# Patient Record
Sex: Female | Born: 1955 | Race: White | Hispanic: No | Marital: Married | Smoking: Never smoker
Health system: Southern US, Community
[De-identification: ages and names within clinical notes are randomized; demographics above are authoritative.]

## PROBLEM LIST (undated history)

## (undated) DIAGNOSIS — J40 Bronchitis, not specified as acute or chronic: Secondary | ICD-10-CM

## (undated) DIAGNOSIS — J45909 Unspecified asthma, uncomplicated: Secondary | ICD-10-CM

## (undated) DIAGNOSIS — Z86006 Personal history of melanoma in-situ: Secondary | ICD-10-CM

## (undated) DIAGNOSIS — M1990 Unspecified osteoarthritis, unspecified site: Secondary | ICD-10-CM

## (undated) DIAGNOSIS — J329 Chronic sinusitis, unspecified: Secondary | ICD-10-CM

## (undated) DIAGNOSIS — Z85828 Personal history of other malignant neoplasm of skin: Secondary | ICD-10-CM

## (undated) HISTORY — DX: Chronic sinusitis, unspecified: J32.9

## (undated) HISTORY — DX: Unspecified asthma, uncomplicated: J45.909

## (undated) HISTORY — DX: Unspecified osteoarthritis, unspecified site: M19.90

## (undated) HISTORY — DX: Personal history of melanoma in-situ: Z86.006

## (undated) HISTORY — DX: Personal history of other malignant neoplasm of skin: Z85.828

## (undated) HISTORY — PX: TONSILLECTOMY: SUR1361

## (undated) HISTORY — DX: Bronchitis, not specified as acute or chronic: J40

---

## 2002-10-04 HISTORY — PX: ABDOMINAL HYSTERECTOMY: SHX81

## 2004-10-30 ENCOUNTER — Inpatient Hospital Stay: Payer: Self-pay | Admitting: Rheumatology

## 2004-11-11 ENCOUNTER — Ambulatory Visit: Payer: Self-pay | Admitting: Unknown Physician Specialty

## 2004-11-27 ENCOUNTER — Ambulatory Visit: Payer: Self-pay | Admitting: Gastroenterology

## 2005-01-11 ENCOUNTER — Ambulatory Visit: Payer: Self-pay | Admitting: Gastroenterology

## 2006-09-22 ENCOUNTER — Ambulatory Visit: Payer: Self-pay | Admitting: Unknown Physician Specialty

## 2008-09-23 ENCOUNTER — Ambulatory Visit: Payer: Self-pay | Admitting: Unknown Physician Specialty

## 2009-09-24 ENCOUNTER — Ambulatory Visit: Payer: Self-pay | Admitting: Unknown Physician Specialty

## 2009-11-14 DIAGNOSIS — Z86006 Personal history of melanoma in-situ: Secondary | ICD-10-CM

## 2009-11-14 HISTORY — DX: Personal history of melanoma in-situ: Z86.006

## 2011-04-09 ENCOUNTER — Ambulatory Visit: Payer: Self-pay | Admitting: Unknown Physician Specialty

## 2012-04-14 ENCOUNTER — Ambulatory Visit: Payer: Self-pay | Admitting: Family Medicine

## 2013-04-20 ENCOUNTER — Ambulatory Visit: Payer: Self-pay | Admitting: Family Medicine

## 2013-06-27 ENCOUNTER — Ambulatory Visit: Payer: Self-pay | Admitting: Family Medicine

## 2013-09-03 ENCOUNTER — Ambulatory Visit: Payer: Self-pay | Admitting: Family Medicine

## 2013-10-29 ENCOUNTER — Encounter: Payer: Self-pay | Admitting: Pulmonary Disease

## 2013-10-29 ENCOUNTER — Ambulatory Visit (INDEPENDENT_AMBULATORY_CARE_PROVIDER_SITE_OTHER): Payer: BC Managed Care – PPO | Admitting: Pulmonary Disease

## 2013-10-29 ENCOUNTER — Encounter (INDEPENDENT_AMBULATORY_CARE_PROVIDER_SITE_OTHER): Payer: Self-pay

## 2013-10-29 VITALS — BP 118/74 | HR 58 | Temp 97.9°F | Ht 65.75 in | Wt 168.0 lb

## 2013-10-29 DIAGNOSIS — J309 Allergic rhinitis, unspecified: Secondary | ICD-10-CM | POA: Insufficient documentation

## 2013-10-29 DIAGNOSIS — J189 Pneumonia, unspecified organism: Secondary | ICD-10-CM | POA: Insufficient documentation

## 2013-10-29 DIAGNOSIS — J411 Mucopurulent chronic bronchitis: Secondary | ICD-10-CM | POA: Insufficient documentation

## 2013-10-29 NOTE — Assessment & Plan Note (Signed)
Rhinitis is contributing to some degree to her cough. She currently does not use Flonase appropriately.  Plan: -Flonase 2 puffs twice a day encouraged -Saline rinses and sprays

## 2013-10-29 NOTE — Assessment & Plan Note (Signed)
The she has no past history of lung disease which would explain why she has had such a problem with her current bronchitis this past year. However she definitely had pneumonia in September of 2014 and has really struggled since then. I am unable to see images from December. I suspect that what is going on is that she's had a series of nasty viral infection since the pneumonia and all of this has led to recurrent "cyclical" cough in that ongoing laryngeal irritation and inflammation is perpetuating the cough.  However, it does bother me that this is been such a lengthy episode and she continues to have mucus production which responds to antibiotics. It does make me consider the possibility of resistant organisms versus an atypical organism like Mycobacterium avium intracellular.  Plan: -Full pulmonary function testing to look for lung disease -CT scan of the chest to look for bronchiectasis, evidence of MAI versus interstitial lung disease -Sputum culture for bacteria, fungal, AFB -Voice rest was strongly encouraged

## 2013-10-29 NOTE — Progress Notes (Signed)
Subjective:    [name], female    DOB: 1956-05-18, 58 y.o.   MRN: [id number]  HPI  Ms. Brenda Burns is here to see me because of on going cough since December. She has had pneumonia and ongoing bronchitis and sinusitis since then.  She describes a persistent cough which continues. She initially had ccolored mucus but lately it has been clear but chunky and thick mucus. Some days are much worse than others. She notes wheezing at night sometime and cough at night.  She had a lot of coughing yesterday.  She has had sinus drainage and congestion for the last three weeks.  She has only used Flonase in terms of sinus therapy.  She only uses this on an as needed basis.    She has noted some chest pain and tightness but no shortness of breath.  This occurs in the evenings without exertion.  It occurs for only a few minutes. This does not happen with walking around.  It is not associated with eating.  No acid reflux.  She has been treated with erythromycin, Levaquin, Doxyccyline and prednisone.  Sometimes the antibiotics will help with her symptoms but then she gets worse again. Th prednisone and the levaquin seem to help the most.   She doesn't recall any sick contacts or a change in her environment.  She has not moved, no new pets, no new house.  She does not have a basement and her crawl space is dry.  She has really tried to identify any new problems.    She has had a lot of sneezing and runny nose in the last few weeks.  She has seen ENT (Dr. Virgia Land) because she had a lot of sinus congestion.    She has had a normal life without significant respiratory symtpoms aside from annual bronchitis which she said was mild.  She has had bronchitis about once a year in her adult life but always got over it really easily.  In 2014 however she has been having a lot of more trouble.   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    Past Medical History  Diagnosis Date  . Chronic sinusitis with recurrent bronchitis      Family History  Problem Relation Age of Onset  . COPD Mother   . Cancer Mother     breast  . Diabetes Paternal Grandfather      History   Social History  . Marital Status: Married    Spouse Name: N/A    Number of Children: N/A  . Years of Education: N/A   Occupational History  . Not on file.   Social History Main Topics  . Smoking status: Never Smoker   . Smokeless tobacco: Never Used  . Alcohol Use: Yes     Comment: occasional wine  . Drug Use: No  . Sexual Activity: Not on file   Other Topics Concern  . Not on file   Social History Narrative  . No narrative on file     Allergies  Allergen Reactions  . Penicillins     Hives, rash     No outpatient prescriptions prior to visit.   No facility-administered medications prior to visit.      Review of Systems  Constitutional: Negative for fever and unexpected weight change.  HENT: Positive  for congestion, postnasal drip and sinus pressure. Negative for dental problem, ear pain, nosebleeds, rhinorrhea, sneezing, sore throat and trouble swallowing.   Eyes: Negative for redness and itching.  Respiratory: Positive for cough, chest tightness, shortness of breath and wheezing.   Cardiovascular: Negative for palpitations and leg swelling.  Gastrointestinal: Negative for nausea and vomiting.  Genitourinary: Negative for dysuria.  Musculoskeletal: Negative for joint swelling.  Skin: Negative for rash.  Neurological: Negative for headaches.  Hematological: Does not bruise/bleed easily.  Psychiatric/Behavioral: Negative for dysphoric mood. The patient is not nervous/anxious.        Objective:   Physical Exam Filed Vitals:   10/29/13 1010  BP: 118/74  Pulse: 58  Temp: 97.9 F (36.6 C)  TempSrc: Oral  Height: 5' 5.75" (1.67 m)  Weight: 168 lb (76.204 kg)  SpO2: 94%   RA  Gen: well appearing, no acute distress HEENT: NCAT, PERRL, EOMi, OP clear, neck supple without masses PULM: CTA B CV: RRR, no mgr, no JVD AB: BS+, soft, nontender, no hsm Ext: warm, no edema, no clubbing, no cyanosis Derm: no rash or skin breakdown Neuro: A&Ox4, CN II-XII intact, strength 5/5 in all 4 extremities  06/27/2013 CXR> RLL pneumonia 09/2013 CXR reportedly normal, I can't see images      Assessment & Plan:   Bronchitis, mucopurulent recurrent The she has no past history of lung disease which would explain why she has had such a problem with her current bronchitis this past year. However she definitely had pneumonia in September of 2014 and has really struggled since then. I am unable to see images from December. I suspect that what is going on is that she's had a series of nasty viral infection since the pneumonia and all of this has led to recurrent "cyclical" cough in that ongoing laryngeal irritation and inflammation is perpetuating the cough.  However, it does bother me that this is been such a lengthy episode and she continues to have mucus production which responds to antibiotics. It does make me consider the possibility of resistant organisms versus an atypical organism like Mycobacterium avium intracellular.  Plan: -Full pulmonary function testing to look for lung disease -CT scan of the chest to look for bronchiectasis, evidence of MAI versus interstitial lung disease -Sputum culture for bacteria, fungal, AFB -Voice rest was strongly encouraged  CAP (community acquired pneumonia) On exam today it does not sound like she has ongoing pneumonia.  Plan: - Plan for CT chest to evaluate for interstitial disease versus bronchiectasis.   Allergic rhinitis Rhinitis is contributing to some degree to her cough. She currently does not use Flonase appropriately.  Plan: -Flonase 2 puffs twice a day encouraged -Saline rinses and sprays   Updated Medication  List Outpatient Encounter Prescriptions as of 10/29/2013  Medication Sig  . albuterol (PROVENTIL HFA;VENTOLIN HFA) 108 (90 BASE) MCG/ACT inhaler Inhale 2 puffs into the lungs every 6 (six) hours as needed for wheezing or shortness of breath.  Marland Kitchen azelastine (ASTELIN) 137 MCG/SPRAY nasal spray Place 2 sprays into both nostrils 2 (two) times daily. Use in each nostril as directed  . levofloxacin (LEVAQUIN) 500 MG tablet Take 500 mg by mouth daily.

## 2013-10-29 NOTE — Assessment & Plan Note (Signed)
On exam today it does not sound like she has ongoing pneumonia.  Plan: - Plan for CT chest to evaluate for interstitial disease versus bronchiectasis.

## 2013-10-29 NOTE — Patient Instructions (Signed)
We will order pulmonary function testing and a CT scan of your chest. For the next three days you need to stop talking, laughing, or singing and definitely [place].  Use over the counter Delsym every 12 hours.  Use hard candies and warm beverages to soothe your throat. Gradually start talking again in three days.  Use the flonase 2 puffs each nostril daily.  Use saline rinses (I like Neil Med rinses made with distilled water) twice a day.  Use chlorpheniramine-phenylephrine combination tablets for sinus congestion and cough.  We will see you back in 2 - 3 weeks or sooner if needed

## 2013-11-01 ENCOUNTER — Ambulatory Visit: Payer: Self-pay | Admitting: Pulmonary Disease

## 2013-11-01 LAB — PULMONARY FUNCTION TEST

## 2013-11-02 ENCOUNTER — Telehealth: Payer: Self-pay

## 2013-11-02 ENCOUNTER — Encounter: Payer: Self-pay | Admitting: Pulmonary Disease

## 2013-11-02 NOTE — Telephone Encounter (Signed)
LMTCB X1 

## 2013-11-02 NOTE — Telephone Encounter (Signed)
Message copied by Len Blalock on Fri Nov 02, 2013 12:43 PM ------      Message from: Simonne Maffucci B      Created: Fri Nov 02, 2013  9:09 AM       A,            Please let her know that her PFT was completely normal            Thanks      B ------

## 2013-11-05 ENCOUNTER — Encounter: Payer: Self-pay | Admitting: Pulmonary Disease

## 2013-11-05 ENCOUNTER — Telehealth: Payer: Self-pay

## 2013-11-05 NOTE — Telephone Encounter (Signed)
Message copied by Len Blalock on Mon Nov 05, 2013  9:32 AM ------      Message from: Simonne Maffucci B      Created: Mon Nov 05, 2013  9:01 AM       A,            Please let her know that her CT chest looked OK            Thanks      B ------

## 2013-11-05 NOTE — Telephone Encounter (Signed)
Pt aware of pft results.  Nothing further needed.

## 2013-11-05 NOTE — Telephone Encounter (Signed)
Pt aware of ct chest results, nothing further needed.

## 2013-11-30 ENCOUNTER — Ambulatory Visit: Payer: BC Managed Care – PPO | Admitting: Pulmonary Disease

## 2013-12-03 ENCOUNTER — Ambulatory Visit (INDEPENDENT_AMBULATORY_CARE_PROVIDER_SITE_OTHER)
Admission: RE | Admit: 2013-12-03 | Discharge: 2013-12-03 | Disposition: A | Discharge status: Home / Self Care | Payer: BC Managed Care – PPO | Source: Ambulatory Visit | Attending: Pulmonary Disease | Admitting: Pulmonary Disease

## 2013-12-03 ENCOUNTER — Ambulatory Visit (INDEPENDENT_AMBULATORY_CARE_PROVIDER_SITE_OTHER): Payer: BC Managed Care – PPO | Admitting: Pulmonary Disease

## 2013-12-03 ENCOUNTER — Encounter: Payer: Self-pay | Admitting: Pulmonary Disease

## 2013-12-03 VITALS — BP 116/64 | HR 65 | Ht 65.75 in | Wt 165.0 lb

## 2013-12-03 DIAGNOSIS — J309 Allergic rhinitis, unspecified: Secondary | ICD-10-CM

## 2013-12-03 DIAGNOSIS — R918 Other nonspecific abnormal finding of lung field: Secondary | ICD-10-CM

## 2013-12-03 DIAGNOSIS — J411 Mucopurulent chronic bronchitis: Secondary | ICD-10-CM

## 2013-12-03 DIAGNOSIS — R9389 Abnormal findings on diagnostic imaging of other specified body structures: Secondary | ICD-10-CM

## 2013-12-03 MED ORDER — HYDROCOD POLST-CHLORPHEN POLST 10-8 MG/5ML PO LQCR: 5.0000 mL | Freq: Every evening | ORAL | Status: DC | PRN

## 2013-12-03 MED ORDER — AZITHROMYCIN 250 MG PO TABS: ORAL_TABLET | ORAL | Status: DC

## 2013-12-03 NOTE — Patient Instructions (Signed)
We will call you with the results of the blood work and the chest x-ray Give [place] a sample of sputum Take the azithromycin as written Use the Advair inhaler one puff twice a day no matter how you feel Use the Tussionex at night Try really hard to suppress the cough as much as possible Use saline rinses Milta Deiters Med rinse) Use over the counter decongestants at night (phenylephrine) If no improvement in cough in 10-14 days then start taking over the counter prilosec daily We will see you back in 3 weeks or sooner if needed

## 2013-12-03 NOTE — Assessment & Plan Note (Signed)
Given the duration of her symptoms with ongoing cough I question the possibility of atypical mycobacterial disease versus pertussis. I suppose eosinophilic bronchitis could last as long as well. I think it is worthwhile to test and treat for infectious processes first and if no improvement then start empiric inhaled corticosteroids  Plan: -Repeat AFB sputum -Draw pertussis serology today -Treat with azithromycin -Flutter valve -When necessary albuterol

## 2013-12-03 NOTE — Assessment & Plan Note (Signed)
Katy had a surprising finding of an isolated right hemidiaphragm elevation today with some right lower lobe atelectasis on her chest x-ray. Her exam in that area just showed wheezing. I explained to her on the phone this evening that I am not sure if this just represents atelectasis from mucus and congestion or if she truly has hemidiaphragm weakness. This would be a new finding compared to her January and December chest imaging.  Plan: -Diaphragm fluoroscopy this week - Flutter valve

## 2013-12-03 NOTE — Assessment & Plan Note (Signed)
I continue to think that this contributes a significant amount to her cough and chest congestion.  Plan: -Add decongestant such as phenylephrine -Use saline rinses not sprays -Continue nasal steroid -Continue Zyrtec

## 2013-12-03 NOTE — Progress Notes (Signed)
Subjective:    [name], female    DOB: 1955/12/24, 58 y.o.   MRN: [id number]  Synopsis: 58 year old female who had a past history significant for annual episodes of bronchitis presented and early 2015 with severe persistent bronchitis and wheezing. Pulmonary function testing in February 2015 was completely normal as well as a CT scan of the chest. Only very small pulmonary nodules are seen in the right middle lobe. A chest x-ray in March 2015 showed an isolated elevated right hemidiaphragm which is a new finding compared to the February study.  HPI  12/03/2013 ROV > Brenda Burns feels pretty bad.  After she saw me she has had persistent cough. She tried cough suppressive therapy with Delsym, hard candies, and initially she saw some improvement. However approximately 4 days ago she started having worsening cough.  She has a rattling in her chest and lot of congestion in her chest.  It is typically brown sputum in color.  She occasionally notes blood in her mucus She has use saline sprays and Flonase. She is very anxious about this.   Past Medical History  Diagnosis Date  . Chronic sinusitis with recurrent bronchitis      Review of Systems  Constitutional: Positive for fatigue. Negative for fever.  HENT: Positive for postnasal drip, rhinorrhea and sinus pressure.   Respiratory: Positive for cough, shortness of breath and wheezing.   Cardiovascular: Negative for chest pain, palpitations and leg swelling.       Objective:   Physical Exam Filed Vitals:   12/03/13 0914  BP: 116/64  Pulse: 65  Height: 5' 5.75" (1.67 m)  Weight: 165 lb (74.844 kg)  SpO2: 96%   RA  Gen: Anxious, no acute distress HEENT: NCAT, EOMi, OP clear, PULM: Wheezing bilaterally, sounds upper airway in nature CV: RRR, no mgr, no JVD AB: BS+, soft, nontender, no hsm Ext: warm, no edema, no clubbing, no cyanosis Derm: no rash or skin breakdown  06/27/2013 CXR [facility] > RLL pneumonia 10/2013 full PFT>  ratio 77%, FEV1 2.89L (119% pred), no change with BD, TLC 5.22L (102% pred) DLCO 25.0 (113% pred) 10/2013 CT Chest> no ILD, few nonspecific pulmonary nodules no bigger than 3 mm in size March 2015 chest x-ray isolated elevated right hemidiaphragm with right lower lobe atelectasis     Assessment & Plan:   Bronchitis, mucopurulent recurrent Given the duration of her symptoms with ongoing cough I question the possibility of atypical mycobacterial disease versus pertussis. I suppose eosinophilic bronchitis could last as long as well. I think it is worthwhile to test and treat for infectious processes first and if no improvement then start empiric inhaled corticosteroids  Plan: -Repeat AFB sputum -Draw pertussis serology today -Treat with azithromycin -Flutter valve -When necessary albuterol  Abnormal chest x-ray Brenda Burns had a surprising finding of an isolated right hemidiaphragm elevation today with some right lower lobe atelectasis on her chest x-ray. Her exam in that area just showed wheezing. I explained to her on the phone this evening that I am not sure if this just represents atelectasis from mucus and congestion or if she truly has hemidiaphragm weakness. This would be a new finding compared to her January and December chest imaging.  Plan: -Diaphragm fluoroscopy this week - Flutter valve  Allergic rhinitis I continue to think that this contributes a significant amount to her cough and chest congestion.  Plan: -Add decongestant such as phenylephrine -Use saline rinses not sprays -Continue nasal steroid -Continue Zyrtec  Updated Medication List Outpatient Encounter Prescriptions as of 12/03/2013  Medication Sig  . albuterol (PROVENTIL HFA;VENTOLIN HFA) 108 (90 BASE) MCG/ACT inhaler Inhale 2 puffs into the lungs every 6 (six) hours as needed for wheezing or shortness of breath.  Marland Kitchen azelastine (ASTELIN) 137 MCG/SPRAY nasal spray Place 2 sprays into both nostrils 2 (two) times  daily. Use in each nostril as directed  . montelukast (SINGULAIR) 10 MG tablet Take 10 mg by mouth daily.  . [DISCONTINUED] levofloxacin (LEVAQUIN) 500 MG tablet Take 500 mg by mouth daily.

## 2013-12-04 ENCOUNTER — Telehealth: Payer: Self-pay

## 2013-12-04 DIAGNOSIS — R9389 Abnormal findings on diagnostic imaging of other specified body structures: Secondary | ICD-10-CM

## 2013-12-04 NOTE — Telephone Encounter (Signed)
I've called [facility] and they have no records of the cultures.  I've attempted to contact [facility] with no success.  I'll keep trying there.

## 2013-12-04 NOTE — Telephone Encounter (Signed)
Message copied by Len Blalock on Tue Dec 04, 2013  5:17 PM ------      Message from: Simonne Maffucci B      Created: Mon Dec 03, 2013  5:13 PM       A,            Can we try to track down her sputum culture results from the last visit?            Thanks,      B ------

## 2013-12-05 ENCOUNTER — Other Ambulatory Visit (INDEPENDENT_AMBULATORY_CARE_PROVIDER_SITE_OTHER): Payer: BC Managed Care – PPO

## 2013-12-05 ENCOUNTER — Other Ambulatory Visit: Payer: Self-pay

## 2013-12-05 ENCOUNTER — Other Ambulatory Visit: Payer: Self-pay | Admitting: Pulmonary Disease

## 2013-12-05 ENCOUNTER — Telehealth: Payer: Self-pay | Admitting: Pulmonary Disease

## 2013-12-05 DIAGNOSIS — J411 Mucopurulent chronic bronchitis: Secondary | ICD-10-CM

## 2013-12-05 LAB — BORDETELLA PERTUSSIS PCR
B PERTUSSIS, DNA: NOT DETECTED
B parapertussis, DNA: NOT DETECTED

## 2013-12-05 LAB — B PERTUSSIS IGG/IGM AB
B pertussis IgG Ab: 3.55 index — ABNORMAL HIGH (ref 0.00–0.94)
B pertussis IgM Ab, Quant: <1 index (ref 0.0–0.9)

## 2013-12-05 MED ORDER — FLUTTER DEVI: 1.0000 | Freq: Four times a day (QID) | Status: DC

## 2013-12-05 MED ORDER — FLUTICASONE PROPIONATE 50 MCG/ACT NA SUSP
2.0000 | Freq: Every day | NASAL | Status: DC
Start: 2013-12-05 — End: 2015-01-13

## 2013-12-05 MED ORDER — PREDNISONE 10 MG PO TABS: ORAL_TABLET | ORAL | Status: DC

## 2013-12-05 NOTE — Addendum Note (Signed)
Addended by: Len Blalock on: 12/05/2013 04:10 PM   Modules accepted: Orders

## 2013-12-05 NOTE — Telephone Encounter (Signed)
I usually give an extra three tablets in case it is needed; she should stop as directed; if the pharmacist takes issue with this then tell her to just change it to 27

## 2013-12-05 NOTE — Telephone Encounter (Signed)
Order has been placed for fluoroscopy.  Patient is aware.  Pt is coming in today to pick up flutter valve.  Nothing else needed at this time.

## 2013-12-05 NOTE — Telephone Encounter (Signed)
Clarified rx with pharmacist.  Nothing further needed.

## 2013-12-05 NOTE — Patient Instructions (Signed)
Chest fluoroscopy has been scheduled at [facility] for Tuesday, march 10 @9 :30.  Pt is to arrive at 9 @medical  mall entrance, registration is on the right.  Per the scheduler at [facility], no precautions about npo after midnight the night before.  Pt is aware of this procedure.  Nothing further needed at this time.

## 2013-12-05 NOTE — Telephone Encounter (Signed)
Called spoke with Tristen from [facility]. She wants clarification on pt prednisone. She received directions: Take 40mg  po daily for 3 days, then take 30mg  po daily for 3 days, then take 20mg  po daily for two days, then take 10mg  po daily for 2 days #30. She reports thix would equal to be #27 tablets. Does Dr. Lake Bells want pt to take as he wrote or should she be on taper for every 3 days? Please advise Dr. Lake Bells thanks

## 2013-12-05 NOTE — Telephone Encounter (Signed)
Message copied by Len Blalock on Wed Dec 05, 2013  9:30 AM ------      Message from: Simonne Maffucci B      Created: Mon Dec 03, 2013  5:15 PM       A,      Please contact her tomorrow to set up a fluoroscopy of her right hemidiaphragm> reason R hemidiaphragm elevation on CXR      Thanks      B ------

## 2013-12-11 ENCOUNTER — Telehealth: Payer: Self-pay | Admitting: Pulmonary Disease

## 2013-12-11 ENCOUNTER — Ambulatory Visit: Payer: Self-pay | Admitting: Pulmonary Disease

## 2013-12-11 NOTE — Telephone Encounter (Signed)
Order printed and refaxed to [facility]

## 2013-12-13 ENCOUNTER — Encounter: Payer: Self-pay | Admitting: Pulmonary Disease

## 2013-12-21 ENCOUNTER — Encounter: Payer: Self-pay | Admitting: Pulmonary Disease

## 2013-12-21 ENCOUNTER — Telehealth: Payer: Self-pay

## 2013-12-21 DIAGNOSIS — J411 Mucopurulent chronic bronchitis: Secondary | ICD-10-CM

## 2013-12-21 NOTE — Telephone Encounter (Signed)
Message copied by Len Blalock on Fri Dec 21, 2013  5:28 PM ------      Message from: Juanito Doom      Created: Fri Dec 21, 2013  6:31 AM       A,            Please let her know that her right diaphragm didn't move quite as much as the radiologist would normally expect, but that it was still moving which was good.              Considering her pulmonary function tests are normal I'm not too worried about this.  However, we will follow this closely with another CXR the next time I see her.  OK to order this            Thanks      B ------

## 2013-12-21 NOTE — Telephone Encounter (Signed)
Pt aware of results and recs.  CXR order to [facility] sent.  Nothing further needed.

## 2013-12-24 ENCOUNTER — Ambulatory Visit (INDEPENDENT_AMBULATORY_CARE_PROVIDER_SITE_OTHER)
Admission: RE | Admit: 2013-12-24 | Discharge: 2013-12-24 | Disposition: A | Discharge status: Home / Self Care | Payer: BC Managed Care – PPO | Source: Ambulatory Visit | Attending: Pulmonary Disease | Admitting: Pulmonary Disease

## 2013-12-24 DIAGNOSIS — J411 Mucopurulent chronic bronchitis: Secondary | ICD-10-CM

## 2013-12-25 ENCOUNTER — Telehealth: Payer: Self-pay

## 2013-12-25 NOTE — Telephone Encounter (Signed)
Message copied by Shavaughn Seidl L on Tue Dec 25, 2013  9:03 AM ------      Message from: Juanito Doom      Created: Tue Dec 25, 2013  7:07 AM       A,            Please let her know that this showed improvement in the diaphragm problem seen on the last chest X-ray.            Thanks      B ------

## 2013-12-25 NOTE — Telephone Encounter (Signed)
Relayed results and recs to pt.  Nothing further needed at this time.

## 2013-12-26 ENCOUNTER — Ambulatory Visit (INDEPENDENT_AMBULATORY_CARE_PROVIDER_SITE_OTHER): Payer: BC Managed Care – PPO | Admitting: Pulmonary Disease

## 2013-12-26 ENCOUNTER — Encounter: Payer: Self-pay | Admitting: Pulmonary Disease

## 2013-12-26 VITALS — BP 112/70 | HR 64 | Ht 65.75 in | Wt 164.0 lb

## 2013-12-26 DIAGNOSIS — R918 Other nonspecific abnormal finding of lung field: Secondary | ICD-10-CM

## 2013-12-26 DIAGNOSIS — R9389 Abnormal findings on diagnostic imaging of other specified body structures: Secondary | ICD-10-CM

## 2013-12-26 DIAGNOSIS — Z23 Encounter for immunization: Secondary | ICD-10-CM

## 2013-12-26 DIAGNOSIS — J411 Mucopurulent chronic bronchitis: Secondary | ICD-10-CM

## 2013-12-26 NOTE — Assessment & Plan Note (Signed)
She had mild hemidiaphragm weakness which appears to have resolved radiographically without further symptoms.  It is unclear what contributed to this but we will need to remember this and get a Chest Xray when she gets sick in the future with a cough or cold.

## 2013-12-26 NOTE — Assessment & Plan Note (Signed)
This has finally resolved thankfully. Given her recurrent episodes of bronchitis (though without asthma or another underlying lung disease) it is reasonable for her to have a pneumonia vaccine.  Today we talked about the importance of hand hygeine and flu shots for prevention.  Plan: -prevnar 13 vaccine

## 2013-12-26 NOTE — Patient Instructions (Signed)
When you get a cold use the following medicines: Sinus symptoms> saline sprays or rinses; phenylephrine or pseudophed; with an antihistamine like chlorpheniramine Cough> dextromethorphan  When you get a cough productive of mucus: See a doctor for antibiotics sooner rather than later  We will see you back in October 2015

## 2013-12-26 NOTE — Progress Notes (Signed)
Subjective:    [name], female    DOB: 1956-06-17, 58 y.o.   MRN: [id number]  Synopsis: 58 year old female who had a past history significant for annual episodes of bronchitis presented and early 2015 with severe persistent bronchitis and wheezing. Pulmonary function testing in February 2015 was completely normal as well as a CT scan of the chest. Only very small pulmonary nodules are seen in the right middle lobe. A chest x-ray in March 2015 showed an isolated elevated right hemidiaphragm which is a new finding compared to the February study.  HPI   12/03/2013 ROV > Shanay feels pretty bad.  After she saw me she has had persistent cough. She tried cough suppressive therapy with Delsym, hard candies, and initially she saw some improvement. However approximately 4 days ago she started having worsening cough.  She has a rattling in her chest and lot of congestion in her chest.  It is typically brown sputum in color.  She occasionally notes blood in her mucus She has use saline sprays and Flonase. She is very anxious about this.  12/26/2013 ROV > Loreley is doing great.  She has a small amount of discomfort in her left chest wall which was worse a month ago but better now.  She had soreness with a cough but that hs resolved.  The cough has resolved.  She has no shortness of breath.     Past Medical History  Diagnosis Date  . Chronic sinusitis with recurrent bronchitis      Review of Systems  Constitutional: Negative for fever and fatigue.  HENT: Negative for postnasal drip, rhinorrhea and sinus pressure.   Respiratory: Negative for cough, shortness of breath and wheezing.   Cardiovascular: Negative for chest pain, palpitations and leg swelling.       Objective:   Physical Exam  Filed Vitals:   12/26/13 1000  BP: 112/70  Pulse: 64  Height: 5' 5.75" (1.67 m)  Weight: 164 lb (74.39 kg)  SpO2: 96%   RA  Gen: no acute distress HEENT: NCAT, EOMi, OP clear, PULM:  Clear to auscultation bilaterally CV: RRR, no mgr, no JVD AB: BS+, soft, nontender, no hsm Ext: warm, no edema, no clubbing, no cyanosis Derm: no rash or skin breakdown  06/27/2013 CXR [facility] > RLL pneumonia 10/2013 full PFT> ratio 77%, FEV1 2.89L (119% pred), no change with BD, TLC 5.22L (102% pred) DLCO 25.0 (113% pred) 10/2013 CT Chest> no ILD, few nonspecific pulmonary nodules no bigger than 3 mm in size March 2015 chest x-ray isolated elevated right hemidiaphragm with right lower lobe atelectasis     Assessment & Plan:   Bronchitis, mucopurulent recurrent This has finally resolved thankfully. Given her recurrent episodes of bronchitis (though without asthma or another underlying lung disease) it is reasonable for her to have a pneumonia vaccine.  Today we talked about the importance of hand hygeine and flu shots for prevention.  Plan: -prevnar 13 vaccine  Abnormal chest x-ray She had mild hemidiaphragm weakness which appears to have resolved radiographically without further symptoms.  It is unclear what contributed to this but we will need to remember this and get a Chest Xray when she gets sick in the future with a cough or cold.    Updated Medication List Outpatient Encounter Prescriptions as of 12/26/2013  Medication Sig  . albuterol (PROVENTIL HFA;VENTOLIN HFA) 108 (90 BASE) MCG/ACT inhaler Inhale 2 puffs into the lungs every 6 (six) hours as needed for wheezing or shortness of  breath.  Marland Kitchen azelastine (ASTELIN) 137 MCG/SPRAY nasal spray Place 2 sprays into both nostrils 2 (two) times daily. Use in each nostril as directed  . fluticasone (FLONASE) 50 MCG/ACT nasal spray Place 2 sprays into both nostrils daily.  . montelukast (SINGULAIR) 10 MG tablet Take 10 mg by mouth daily.  . chlorpheniramine-HYDROcodone (TUSSIONEX PENNKINETIC ER) 10-8 MG/5ML LQCR Take 5 mLs by mouth at bedtime as needed for cough.  Marland Kitchen Respiratory Therapy Supplies (FLUTTER) DEVI 1 Device by Does not apply route 4  (four) times daily.  . [DISCONTINUED] azithromycin (ZITHROMAX) 250 MG tablet Take 500mg  on day one, then 250mg  daily for 4 days  . [DISCONTINUED] predniSONE (DELTASONE) 10 MG tablet Take 40mg  po daily for 3 days, then take 30mg  po daily for 3 days, then take 20mg  po daily for two days, then take 10mg  po daily for 2 days

## 2013-12-27 ENCOUNTER — Encounter: Payer: Self-pay | Admitting: Pulmonary Disease

## 2014-01-20 LAB — AFB CULTURE WITH SMEAR (NOT AT ARMC): ACID FAST SMEAR: NONE SEEN

## 2014-02-04 ENCOUNTER — Encounter: Payer: Self-pay | Admitting: Pulmonary Disease

## 2014-03-07 ENCOUNTER — Encounter: Payer: Self-pay | Admitting: Internal Medicine

## 2014-03-07 ENCOUNTER — Ambulatory Visit (INDEPENDENT_AMBULATORY_CARE_PROVIDER_SITE_OTHER): Payer: BC Managed Care – PPO | Admitting: Internal Medicine

## 2014-03-07 ENCOUNTER — Encounter (INDEPENDENT_AMBULATORY_CARE_PROVIDER_SITE_OTHER): Payer: Self-pay

## 2014-03-07 VITALS — BP 130/80 | HR 58 | Temp 97.5°F | Ht 65.75 in | Wt 166.8 lb

## 2014-03-07 DIAGNOSIS — J45909 Unspecified asthma, uncomplicated: Secondary | ICD-10-CM

## 2014-03-07 DIAGNOSIS — E7800 Pure hypercholesterolemia, unspecified: Secondary | ICD-10-CM

## 2014-03-07 DIAGNOSIS — Z1239 Encounter for other screening for malignant neoplasm of breast: Secondary | ICD-10-CM

## 2014-03-07 DIAGNOSIS — J411 Mucopurulent chronic bronchitis: Secondary | ICD-10-CM

## 2014-03-07 DIAGNOSIS — J309 Allergic rhinitis, unspecified: Secondary | ICD-10-CM

## 2014-03-07 DIAGNOSIS — M129 Arthropathy, unspecified: Secondary | ICD-10-CM

## 2014-03-07 DIAGNOSIS — N809 Endometriosis, unspecified: Secondary | ICD-10-CM

## 2014-03-07 DIAGNOSIS — J40 Bronchitis, not specified as acute or chronic: Secondary | ICD-10-CM

## 2014-03-07 DIAGNOSIS — M1990 Unspecified osteoarthritis, unspecified site: Secondary | ICD-10-CM

## 2014-03-07 MED ORDER — TRIAMCINOLONE ACETONIDE 0.1 % EX CREA: 1.0000 "application " | TOPICAL_CREAM | Freq: Two times a day (BID) | CUTANEOUS | Status: DC

## 2014-03-07 NOTE — Progress Notes (Signed)
Pre visit review using our clinic review tool, if applicable. No additional management support is needed unless otherwise documented below in the visit note. 

## 2014-03-10 ENCOUNTER — Encounter: Payer: Self-pay | Admitting: Internal Medicine

## 2014-03-10 DIAGNOSIS — M1990 Unspecified osteoarthritis, unspecified site: Secondary | ICD-10-CM | POA: Insufficient documentation

## 2014-03-10 DIAGNOSIS — N809 Endometriosis, unspecified: Secondary | ICD-10-CM | POA: Insufficient documentation

## 2014-03-10 NOTE — Assessment & Plan Note (Signed)
Controlled on current regimen.   

## 2014-03-10 NOTE — Assessment & Plan Note (Signed)
Hand stiffness.  Stable.  Follow.

## 2014-03-10 NOTE — Assessment & Plan Note (Signed)
S/p hysterectomy.  

## 2014-03-10 NOTE — Progress Notes (Signed)
Subjective:    [name], female    DOB: 10-18-1955, 58 y.o.   MRN: [id number]  HPI 58 year old female with past history of reoccurring bronchitis and asthma who comes in today to follow up on these issues as well as to establish care.  Former pt of Dr Gorden Harms.  Has been seeing Dr Venia Minks.  In 8/14 she was diagnosed with pneumonia.  Then reports reoccurring episodes of bronchitis.  Treated.  Seeing Dr Lake Bells.  Refer to his notes for details.  Breathing is doing well.  No cough or congestion.  Takes singulair on a regular basis.  She is doing well on her current regimen.  Rarely has to use her rescue inhaler.  Stays active.  Eating and drinking well.  Has retired.  Plans to retire soon.  Bowels stable.  No acid reflux.     Past Medical History  Diagnosis Date  . Chronic sinusitis with recurrent bronchitis   . Asthma   . Arthritis     Outpatient Encounter Prescriptions as of 03/07/2014  Medication Sig  . B Complex-C (B-COMPLEX WITH VITAMIN C) tablet Take 1 tablet by mouth daily.  . Calcium Carbonate-Vitamin D (CALTRATE 600+D PO) Take by mouth.  . cetirizine (ZYRTEC) 10 MG tablet Take 10 mg by mouth daily.  . fluticasone (FLONASE) 50 MCG/ACT nasal spray Place 2 sprays into both nostrils daily.  . montelukast (SINGULAIR) 10 MG tablet Take 10 mg by mouth daily.  . Multiple Vitamins-Minerals (CENTRUM SILVER PO) Take by mouth.  Marland Kitchen omeprazole (PRILOSEC) 20 MG capsule Take 20 mg by mouth daily.  . Red Yeast Rice Extract (RED YEAST RICE PO) Take by mouth.  . triamcinolone cream (KENALOG) 0.1 % Apply 1 application topically 2 (two) times daily.  . [DISCONTINUED] albuterol (PROVENTIL HFA;VENTOLIN HFA) 108 (90 BASE) MCG/ACT inhaler Inhale 2 puffs into the lungs every 6 (six) hours as needed for wheezing or shortness of breath.  . [DISCONTINUED] azelastine (ASTELIN) 137 MCG/SPRAY nasal spray Place 2 sprays into both nostrils 2 (two) times daily. Use in each nostril as directed  .  [DISCONTINUED] chlorpheniramine-HYDROcodone (TUSSIONEX PENNKINETIC ER) 10-8 MG/5ML LQCR Take 5 mLs by mouth at bedtime as needed for cough.  . [DISCONTINUED] Respiratory Therapy Supplies (FLUTTER) DEVI 1 Device by Does not apply route 4 (four) times daily.    Review of Systems Patient denies any headache, lightheadedness or dizziness.  No sinus or allergy symptoms currently.  No chest pain, tightness or palpitations.  No increased shortness of breath, cough or congestion.  Breathing doing well.  No nausea or vomiting.  No acid reflux.  No abdominal pain or cramping.  No bowel change, such as diarrhea, constipation, BRBPR or melana.  No urine change.   Does have some hand stiffness.  Some arthritis in her hands.       Objective:   Physical Exam Filed Vitals:   03/07/14 1025  BP: 130/80  Pulse: 58  Temp: 97.5 F (87.101 C)   58 year old female in no acute distress.   HEENT:  Nares- clear.  Oropharynx - without lesions. NECK:  Supple.  Nontender.  No audible bruit.  HEART:  Appears to be regular. LUNGS:  No crackles or wheezing audible.  Respirations even and unlabored.  RADIAL PULSE:  Equal bilaterally.  ABDOMEN:  Soft, nontender.  Bowel sounds present and normal.  No audible abdominal bruit.   EXTREMITIES:  No increased edema present.  DP pulses palpable and equal bilaterally.  Assessment & Plan:  HEALTH MAINTENANCE.  Schedule her for a physical next visit.  Schedule mammogram.  Last 7/14.  Last colonoscopy 2009.  Has never had an abnormal pap smear.    I spent 30 minutes with the patient and more than 50% of the time was spent in consultation regarding the above.

## 2014-03-10 NOTE — Assessment & Plan Note (Signed)
Has resolved now.  Doing well.

## 2014-04-22 ENCOUNTER — Encounter: Payer: Self-pay | Admitting: *Deleted

## 2014-04-22 ENCOUNTER — Ambulatory Visit: Payer: Self-pay | Admitting: Internal Medicine

## 2014-04-22 LAB — HM MAMMOGRAPHY: HM Mammogram: NEGATIVE

## 2014-04-27 LAB — URINALYSIS, COMPLETE
Bilirubin,UR: NEGATIVE
Blood: NEGATIVE
GLUCOSE, UR: NEGATIVE mg/dL (ref 0–75)
KETONE: NEGATIVE
LEUKOCYTE ESTERASE: NEGATIVE
Nitrite: NEGATIVE
Ph: 9 (ref 4.5–8.0)
RBC,UR: 2 /HPF (ref 0–5)
SPECIFIC GRAVITY: 1.018 (ref 1.003–1.030)
Squamous Epithelial: <1
WBC UR: 4 /HPF (ref 0–5)

## 2014-04-27 LAB — CBC WITH DIFFERENTIAL/PLATELET
BASOS PCT: 0.2 %
Basophil #: 0 10*3/uL (ref 0.0–0.1)
EOS ABS: 0 10*3/uL (ref 0.0–0.7)
Eosinophil %: 0.1 %
HCT: 44.3 % (ref 35.0–47.0)
HGB: 14.8 g/dL (ref 12.0–16.0)
LYMPHS PCT: 8.5 %
Lymphocyte #: 0.9 10*3/uL — ABNORMAL LOW (ref 1.0–3.6)
MCH: 29 pg (ref 26.0–34.0)
MCHC: 33.4 g/dL (ref 32.0–36.0)
MCV: 87 fL (ref 80–100)
Monocyte #: 0.5 x10 3/mm (ref 0.2–0.9)
Monocyte %: 4.8 %
Neutrophil #: 8.8 10*3/uL — ABNORMAL HIGH (ref 1.4–6.5)
Neutrophil %: 86.4 %
Platelet: 197 10*3/uL (ref 150–440)
RBC: 5.1 10*6/uL (ref 3.80–5.20)
RDW: 13.2 % (ref 11.5–14.5)
WBC: 10.2 10*3/uL (ref 3.6–11.0)

## 2014-04-27 LAB — COMPREHENSIVE METABOLIC PANEL
Albumin: 4 g/dL (ref 3.4–5.0)
Alkaline Phosphatase: 86 U/L
Anion Gap: 9 (ref 7–16)
BUN: 15 mg/dL (ref 7–18)
Bilirubin,Total: 0.3 mg/dL (ref 0.2–1.0)
CO2: 27 mmol/L (ref 21–32)
CREATININE: 0.91 mg/dL (ref 0.60–1.30)
Calcium, Total: 9.6 mg/dL (ref 8.5–10.1)
Chloride: 103 mmol/L (ref 98–107)
EGFR (African American): >60
GLUCOSE: 112 mg/dL — AB (ref 65–99)
Osmolality: 279 (ref 275–301)
Potassium: 3.5 mmol/L (ref 3.5–5.1)
SGOT(AST): 28 U/L (ref 15–37)
SGPT (ALT): 26 U/L
SODIUM: 139 mmol/L (ref 136–145)
TOTAL PROTEIN: 7.8 g/dL (ref 6.4–8.2)

## 2014-04-27 LAB — LIPASE, BLOOD: Lipase: 104 U/L (ref 73–393)

## 2014-04-28 ENCOUNTER — Observation Stay: Payer: Self-pay | Admitting: Internal Medicine

## 2014-04-29 LAB — CBC WITH DIFFERENTIAL/PLATELET
BASOS ABS: 0 10*3/uL (ref 0.0–0.1)
BASOS PCT: 0.3 %
EOS PCT: 0.7 %
Eosinophil #: 0.1 10*3/uL (ref 0.0–0.7)
HCT: 36.3 % (ref 35.0–47.0)
HGB: 12.2 g/dL (ref 12.0–16.0)
LYMPHS PCT: 17.6 %
Lymphocyte #: 1.5 10*3/uL (ref 1.0–3.6)
MCH: 29.1 pg (ref 26.0–34.0)
MCHC: 33.5 g/dL (ref 32.0–36.0)
MCV: 87 fL (ref 80–100)
MONOS PCT: 8 %
Monocyte #: 0.7 x10 3/mm (ref 0.2–0.9)
NEUTROS ABS: 6.2 10*3/uL (ref 1.4–6.5)
Neutrophil %: 73.4 %
PLATELETS: 129 10*3/uL — AB (ref 150–440)
RBC: 4.18 10*6/uL (ref 3.80–5.20)
RDW: 13.2 % (ref 11.5–14.5)
WBC: 8.5 10*3/uL (ref 3.6–11.0)

## 2014-04-29 LAB — MAGNESIUM: Magnesium: 1.6 mg/dL — ABNORMAL LOW

## 2014-04-29 LAB — WBCS, STOOL

## 2014-04-29 LAB — BASIC METABOLIC PANEL
Anion Gap: 8 (ref 7–16)
BUN: 7 mg/dL (ref 7–18)
Calcium, Total: 8.2 mg/dL — ABNORMAL LOW (ref 8.5–10.1)
Chloride: 107 mmol/L (ref 98–107)
Co2: 26 mmol/L (ref 21–32)
Creatinine: 0.79 mg/dL (ref 0.60–1.30)
EGFR (African American): >60
GLUCOSE: 91 mg/dL (ref 65–99)
Osmolality: 279 (ref 275–301)
POTASSIUM: 3.1 mmol/L — AB (ref 3.5–5.1)
SODIUM: 141 mmol/L (ref 136–145)

## 2014-04-29 LAB — CLOSTRIDIUM DIFFICILE(ARMC)

## 2014-04-29 LAB — HM COLONOSCOPY

## 2014-04-30 ENCOUNTER — Telehealth: Payer: Self-pay | Admitting: Internal Medicine

## 2014-04-30 LAB — POTASSIUM: Potassium: 3.4 mmol/L — ABNORMAL LOW (ref 3.5–5.1)

## 2014-04-30 LAB — MAGNESIUM: Magnesium: 2 mg/dL

## 2014-04-30 LAB — PATHOLOGY REPORT

## 2014-04-30 NOTE — Telephone Encounter (Signed)
Shawn from [facility] called and said pt was seen in emergency room for colitis and needs a hospital follow up appt within 1-2 weeks.

## 2014-04-30 NOTE — Telephone Encounter (Signed)
Pt was in hospital for Colitis, she was just getting home when I spoke with her. She still complain of pain, but overall better than she was prior to ER visit.

## 2014-04-30 NOTE — Telephone Encounter (Signed)
Please advise on time & date (Will check on patient, request ER records & give appt all at once)

## 2014-04-30 NOTE — Telephone Encounter (Signed)
I can see her 05/07/14 11:15.  Block the 30 minute spot.

## 2014-05-01 LAB — STOOL CULTURE

## 2014-05-07 ENCOUNTER — Ambulatory Visit (INDEPENDENT_AMBULATORY_CARE_PROVIDER_SITE_OTHER): Payer: BC Managed Care – PPO | Admitting: Internal Medicine

## 2014-05-07 ENCOUNTER — Encounter: Payer: Self-pay | Admitting: Internal Medicine

## 2014-05-07 VITALS — BP 110/70 | HR 61 | Temp 98.4°F | Ht 65.75 in | Wt 162.0 lb

## 2014-05-07 DIAGNOSIS — J309 Allergic rhinitis, unspecified: Secondary | ICD-10-CM | POA: Diagnosis not present

## 2014-05-07 DIAGNOSIS — K559 Vascular disorder of intestine, unspecified: Secondary | ICD-10-CM | POA: Diagnosis not present

## 2014-05-07 NOTE — Progress Notes (Signed)
Pre visit review using our clinic review tool, if applicable. No additional management support is needed unless otherwise documented below in the visit note. 

## 2014-05-08 ENCOUNTER — Encounter: Payer: Self-pay | Admitting: Internal Medicine

## 2014-05-08 DIAGNOSIS — Z8719 Personal history of other diseases of the digestive system: Secondary | ICD-10-CM | POA: Insufficient documentation

## 2014-05-08 NOTE — Assessment & Plan Note (Signed)
Controlled on current regimen.   She is wanting a trial off singulair since stable.  Follow.

## 2014-05-08 NOTE — Progress Notes (Signed)
Subjective:    [name], female    DOB: September 17, 1956, 58 y.o.   MRN: [id number]  HPI 58 year old female with past history of reoccurring bronchitis and asthma who comes in today for a hospital follow up.  she was admitted 04/28/14 - 04/30/14 with abdominal pain, nausea, vomiting and diarrhea.  She was diagnosed with colitis based on CT findings.  Dr Candace Cruise evaluated pt and colonoscopy revealed what appeared to be ischemic colitis.  She reports she is feeling much better.  Appetite is improving.  Bowels are better.  Still soft.  Taking cipro and flagyl.  We discussed probiotics.   Breathing is doing well.  No cough or congestion.  Takes singulair on a regular basis.  She is doing well on her current regimen.  Was questioning if she could stop the singulair since doing well.  Due to f/u with Dr Candace Cruise 05/15/14.     Past Medical History  Diagnosis Date  . Chronic sinusitis with recurrent bronchitis   . Asthma   . Arthritis     Outpatient Encounter Prescriptions as of 05/07/2014  Medication Sig  . B Complex-C (B-COMPLEX WITH VITAMIN C) tablet Take 1 tablet by mouth daily.  . Calcium Carbonate-Vitamin D (CALTRATE 600+D PO) Take by mouth.  . ciprofloxacin (CIPRO) 500 MG tablet Take 500 mg by mouth 2 (two) times daily.  . metroNIDAZOLE (FLAGYL) 500 MG tablet Take 500 mg by mouth 3 (three) times daily.  . Multiple Vitamins-Minerals (CENTRUM SILVER PO) Take by mouth.  Marland Kitchen omeprazole (PRILOSEC) 20 MG capsule Take 20 mg by mouth daily.  . Red Yeast Rice Extract (RED YEAST RICE PO) Take by mouth.  . triamcinolone cream (KENALOG) 0.1 % Apply 1 application topically 2 (two) times daily.  . cetirizine (ZYRTEC) 10 MG tablet Take 10 mg by mouth daily.  . fluticasone (FLONASE) 50 MCG/ACT nasal spray Place 2 sprays into both nostrils daily.  . montelukast (SINGULAIR) 10 MG tablet Take 10 mg by mouth daily.    Review of Systems Patient denies any headache, lightheadedness or dizziness.  No sinus or  allergy symptoms currently.  No chest pain, tightness or palpitations.  No increased shortness of breath, cough or congestion.  Breathing doing well.  No nausea or vomiting now.   No acid reflux.  Abdominal pain better.   No BRBPR or melana.  Bowels better.  On cipro/flagyl.        Objective:   Physical Exam  Filed Vitals:   05/07/14 1127  BP: 110/70  Pulse: 61  Temp: 98.4 F (26.7 C)   58 year old female in no acute distress.   HEENT:  Nares- clear.  Oropharynx - without lesions. NECK:  Supple.  Nontender.  No audible bruit.  HEART:  Appears to be regular. LUNGS:  No crackles or wheezing audible.  Respirations even and unlabored.  RADIAL PULSE:  Equal bilaterally.  ABDOMEN:  Soft.  No significant tenderness to palpation.    Bowel sounds present and normal.  No audible abdominal bruit.   EXTREMITIES:  No increased edema present.  DP pulses palpable and equal bilaterally.          Assessment & Plan:  HEALTH MAINTENANCE.  Scheduled for a physical next visit.  Mammogram 04/22/14 - Birads I.    Last 7/14.  Last colonoscopy 2009.  Has never had an abnormal pap smear.    I spent 25 minutes with the patient and more than 50% of the time  was spent in consultation regarding the above (specifically reviewing hospital records, discussion about her pre hospitalization symptoms and her hospitalization and current treatment).

## 2014-05-08 NOTE — Assessment & Plan Note (Signed)
Recently admitted with abdominal pain, nausea, vomiting and diarrhea.  On cipro and flagyl.  Colonoscopy as outlined.  Found to have presumed ischemic colitis.  Has f/u planned with Dr Candace Cruise 05/15/14.  Doing better.  Advance diet slowly.  Follow.

## 2014-05-21 ENCOUNTER — Other Ambulatory Visit: Payer: Self-pay | Admitting: Gastroenterology

## 2014-05-21 LAB — CLOSTRIDIUM DIFFICILE(ARMC)

## 2014-05-22 LAB — WBCS, STOOL

## 2014-05-29 ENCOUNTER — Encounter: Payer: Self-pay | Admitting: Internal Medicine

## 2014-06-13 ENCOUNTER — Ambulatory Visit (INDEPENDENT_AMBULATORY_CARE_PROVIDER_SITE_OTHER): Payer: BC Managed Care – PPO | Admitting: Internal Medicine

## 2014-06-13 ENCOUNTER — Other Ambulatory Visit (HOSPITAL_COMMUNITY)
Admission: RE | Admit: 2014-06-13 | Discharge: 2014-06-13 | Disposition: A | Discharge status: Home / Self Care | Payer: BC Managed Care – PPO | Source: Ambulatory Visit | Attending: Internal Medicine | Admitting: Internal Medicine

## 2014-06-13 ENCOUNTER — Encounter: Payer: Self-pay | Admitting: Internal Medicine

## 2014-06-13 VITALS — BP 120/70 | HR 56 | Temp 97.6°F | Ht 64.5 in | Wt 155.0 lb

## 2014-06-13 DIAGNOSIS — J309 Allergic rhinitis, unspecified: Secondary | ICD-10-CM

## 2014-06-13 DIAGNOSIS — N809 Endometriosis, unspecified: Secondary | ICD-10-CM

## 2014-06-13 DIAGNOSIS — R7989 Other specified abnormal findings of blood chemistry: Secondary | ICD-10-CM

## 2014-06-13 DIAGNOSIS — Z1151 Encounter for screening for human papillomavirus (HPV): Secondary | ICD-10-CM | POA: Insufficient documentation

## 2014-06-13 DIAGNOSIS — Z124 Encounter for screening for malignant neoplasm of cervix: Secondary | ICD-10-CM

## 2014-06-13 DIAGNOSIS — Z01419 Encounter for gynecological examination (general) (routine) without abnormal findings: Secondary | ICD-10-CM | POA: Diagnosis not present

## 2014-06-13 DIAGNOSIS — D696 Thrombocytopenia, unspecified: Secondary | ICD-10-CM

## 2014-06-13 DIAGNOSIS — Z23 Encounter for immunization: Secondary | ICD-10-CM

## 2014-06-13 DIAGNOSIS — M1990 Unspecified osteoarthritis, unspecified site: Secondary | ICD-10-CM

## 2014-06-13 DIAGNOSIS — R945 Abnormal results of liver function studies: Secondary | ICD-10-CM

## 2014-06-13 DIAGNOSIS — Z13220 Encounter for screening for lipoid disorders: Secondary | ICD-10-CM

## 2014-06-13 DIAGNOSIS — K559 Vascular disorder of intestine, unspecified: Secondary | ICD-10-CM

## 2014-06-13 DIAGNOSIS — M129 Arthropathy, unspecified: Secondary | ICD-10-CM

## 2014-06-13 LAB — HEPATIC FUNCTION PANEL
ALK PHOS: 92 U/L (ref 39–117)
ALT: 62 U/L — AB (ref 0–35)
AST: 42 U/L — AB (ref 0–37)
Albumin: 4.3 g/dL (ref 3.5–5.2)
BILIRUBIN DIRECT: 0 mg/dL (ref 0.0–0.3)
BILIRUBIN TOTAL: 0.6 mg/dL (ref 0.2–1.2)
Total Protein: 7.5 g/dL (ref 6.0–8.3)

## 2014-06-13 LAB — LIPID PANEL
CHOL/HDL RATIO: 5
Cholesterol: 272 mg/dL — ABNORMAL HIGH (ref 0–200)
HDL: 55.5 mg/dL (ref >39.00)
LDL CALC: 192 mg/dL — AB (ref 0–99)
NonHDL: 216.5
Triglycerides: 122 mg/dL (ref 0.0–149.0)
VLDL: 24.4 mg/dL (ref 0.0–40.0)

## 2014-06-13 LAB — CBC WITH DIFFERENTIAL/PLATELET
BASOS ABS: 0 10*3/uL (ref 0.0–0.1)
Basophils Relative: 0.5 % (ref 0.0–3.0)
EOS PCT: 3.5 % (ref 0.0–5.0)
Eosinophils Absolute: 0.1 10*3/uL (ref 0.0–0.7)
HCT: 43.1 % (ref 36.0–46.0)
Hemoglobin: 14.2 g/dL (ref 12.0–15.0)
Lymphocytes Relative: 40 % (ref 12.0–46.0)
Lymphs Abs: 1.4 10*3/uL (ref 0.7–4.0)
MCHC: 32.8 g/dL (ref 30.0–36.0)
MCV: 87 fl (ref 78.0–100.0)
MONOS PCT: 8.1 % (ref 3.0–12.0)
Monocytes Absolute: 0.3 10*3/uL (ref 0.1–1.0)
Neutro Abs: 1.7 10*3/uL (ref 1.4–7.7)
Neutrophils Relative %: 47.9 % (ref 43.0–77.0)
PLATELETS: 195 10*3/uL (ref 150.0–400.0)
RBC: 4.96 Mil/uL (ref 3.87–5.11)
RDW: 14.1 % (ref 11.5–15.5)
WBC: 3.6 10*3/uL — AB (ref 4.0–10.5)

## 2014-06-13 LAB — TSH: TSH: 1.23 u[IU]/mL (ref 0.35–4.50)

## 2014-06-13 NOTE — Progress Notes (Signed)
Pre visit review using our clinic review tool, if applicable. No additional management support is needed unless otherwise documented below in the visit note. 

## 2014-06-13 NOTE — Progress Notes (Signed)
Subjective:    [name], female    DOB: 10-14-1955, 58 y.o.   MRN: [id number]  HPI 58 year old female with past history of reoccurring bronchitis and asthma who comes in today to follow up on these issues as well as for a complete physical exam.  She was admitted 04/28/14 - 04/30/14 with abdominal pain, nausea, vomiting and diarrhea.  She was diagnosed with colitis based on CT findings.  Dr Candace Cruise evaluated pt and colonoscopy revealed what appeared to be ischemic colitis.  She had f/u with him in 8/15.  Planning to f/u with another colonoscopy 08/01/14.  No pain now.  No nausea or vomiting.   Bowels are better.  She has adjusted her diet.  Has lost some weight.  Exercising.  Breathing is doing well.  No cough or congestion. Overall feels good.       Past Medical History  Diagnosis Date  . Chronic sinusitis with recurrent bronchitis   . Asthma   . Arthritis     Outpatient Encounter Prescriptions as of 06/13/2014  Medication Sig  . B Complex-C (B-COMPLEX WITH VITAMIN C) tablet Take 1 tablet by mouth daily.  . Calcium Carbonate-Vitamin D (CALTRATE 600+D PO) Take by mouth.  . cetirizine (ZYRTEC) 10 MG tablet Take 10 mg by mouth daily.  . fluticasone (FLONASE) 50 MCG/ACT nasal spray Place 2 sprays into both nostrils daily.  . Multiple Vitamins-Minerals (CENTRUM SILVER PO) Take by mouth.  Marland Kitchen omeprazole (PRILOSEC) 20 MG capsule Take 20 mg by mouth daily.  . Red Yeast Rice Extract (RED YEAST RICE PO) Take by mouth.  . [DISCONTINUED] ciprofloxacin (CIPRO) 500 MG tablet Take 500 mg by mouth 2 (two) times daily.  . [DISCONTINUED] metroNIDAZOLE (FLAGYL) 500 MG tablet Take 500 mg by mouth 3 (three) times daily.  . [DISCONTINUED] montelukast (SINGULAIR) 10 MG tablet Take 10 mg by mouth daily.  . [DISCONTINUED] triamcinolone cream (KENALOG) 0.1 % Apply 1 application topically 2 (two) times daily.    Review of Systems Patient denies any headache, lightheadedness or dizziness.  No sinus  or allergy symptoms currently.  No chest pain, tightness or palpitations.  No increased shortness of breath, cough or congestion.  Breathing doing well.  No nausea or vomiting.  No acid reflux.  No abdominal pain.   No BRBPR or melana.  Bowels better.  Has adjusted her diet.  Losing weight.  (trying to).  Exercising.        Objective:   Physical Exam  Filed Vitals:   06/13/14 0935  BP: 120/70  Pulse: 56  Temp: 97.6 F (44.47 C)   58 year old female in no acute distress.   HEENT:  Nares- clear.  Oropharynx - without lesions. NECK:  Supple.  Nontender.  No audible bruit.  HEART:  Appears to be regular. LUNGS:  No crackles or wheezing audible.  Respirations even and unlabored.  RADIAL PULSE:  Equal bilaterally.    BREASTS:  No nipple discharge or nipple retraction present.  Could not appreciate any distinct nodules or axillary adenopathy.  ABDOMEN:  Soft, nontender.  Bowel sounds present and normal.  No audible abdominal bruit.  GU:  Normal external genitalia.  Vaginal vault without lesions.  S/p hysterectomy.  Pap performed. Could not appreciate any adnexal masses or tenderness.   RECTAL:  Not performed.   EXTREMITIES:  No increased edema present.  DP pulses palpable and equal bilaterally.          Assessment & Plan:  HEALTH MAINTENANCE.  Physical today.  Mammogram 04/22/14 - Birads I.     Last colonoscopy performed while in the hospital (7/15).  Planning for f/u colonoscopy 08/01/14.    I spent 25 minutes with the patient and more than 50% of the time was spent in consultation regarding the above.

## 2014-06-14 ENCOUNTER — Other Ambulatory Visit: Payer: Self-pay | Admitting: Internal Medicine

## 2014-06-14 DIAGNOSIS — D72819 Decreased white blood cell count, unspecified: Secondary | ICD-10-CM

## 2014-06-14 DIAGNOSIS — R945 Abnormal results of liver function studies: Secondary | ICD-10-CM

## 2014-06-14 DIAGNOSIS — R7989 Other specified abnormal findings of blood chemistry: Secondary | ICD-10-CM

## 2014-06-14 NOTE — Progress Notes (Signed)
Order placed for f/u labs.  

## 2014-06-16 ENCOUNTER — Encounter: Payer: Self-pay | Admitting: Internal Medicine

## 2014-06-16 DIAGNOSIS — D696 Thrombocytopenia, unspecified: Secondary | ICD-10-CM | POA: Insufficient documentation

## 2014-06-16 DIAGNOSIS — R7989 Other specified abnormal findings of blood chemistry: Secondary | ICD-10-CM | POA: Insufficient documentation

## 2014-06-16 DIAGNOSIS — R945 Abnormal results of liver function studies: Secondary | ICD-10-CM | POA: Insufficient documentation

## 2014-06-16 NOTE — Assessment & Plan Note (Signed)
Recheck liver panel today.  

## 2014-06-16 NOTE — Assessment & Plan Note (Signed)
Hand stiffness.  Stable.  Follow.

## 2014-06-16 NOTE — Assessment & Plan Note (Signed)
Controlled on current regimen.  Follow.  

## 2014-06-16 NOTE — Assessment & Plan Note (Signed)
Recently admitted with abdominal pain, nausea, vomiting and diarrhea.  Treated with cipro and flagyl.  Colonoscopy as outlined.  Found to have presumed ischemic colitis.  Had f/u planned with Dr Candace Cruise 05/15/14.  Doing better.  Has adjusted her diet.  Feels better.  Planning to have a f/u colonoscopy 08/01/14.

## 2014-06-16 NOTE — Assessment & Plan Note (Signed)
Recent platelet count decreased in the hospital.  F/u labs at GI - normal.  Follow.

## 2014-06-16 NOTE — Assessment & Plan Note (Signed)
S/p hysterectomy.  Pelvic/pap today.

## 2014-06-17 ENCOUNTER — Encounter: Payer: Self-pay | Admitting: *Deleted

## 2014-06-17 LAB — CYTOLOGY - PAP

## 2014-07-01 ENCOUNTER — Other Ambulatory Visit: Payer: BC Managed Care – PPO

## 2014-08-01 ENCOUNTER — Ambulatory Visit: Payer: Self-pay | Admitting: Gastroenterology

## 2014-08-01 LAB — HM COLONOSCOPY

## 2014-08-09 ENCOUNTER — Encounter: Payer: Self-pay | Admitting: Internal Medicine

## 2014-11-06 ENCOUNTER — Encounter: Payer: Self-pay | Admitting: Nurse Practitioner

## 2014-11-06 ENCOUNTER — Ambulatory Visit (INDEPENDENT_AMBULATORY_CARE_PROVIDER_SITE_OTHER): Payer: BC Managed Care – PPO | Admitting: Nurse Practitioner

## 2014-11-06 VITALS — BP 110/70 | HR 64 | Temp 97.5°F | Resp 12 | Ht 64.5 in | Wt 156.1 lb

## 2014-11-06 DIAGNOSIS — J069 Acute upper respiratory infection, unspecified: Secondary | ICD-10-CM

## 2014-11-06 MED ORDER — LEVOFLOXACIN 500 MG PO TABS: 500.0000 mg | ORAL_TABLET | Freq: Every day | ORAL | Status: DC

## 2014-11-06 NOTE — Patient Instructions (Addendum)
PND and allergies can be treated with benadryl. Benadryl is the most effective for drying you up but it is also the most sedating,  So try taking it at night and use one of the others in the daytime (Zyrtec).   Add Sudafed PE as needed for congestion (Coricidin HBP for those with high blood pressure).   "DM" stands for dextromethorphan which is a cough suppressant.  The best/strongest available OTC cough suppressant is Delsym.  Consider using simply Saline to flush your sinuses twice daily when you have congestion to prevent sinus infections   Call us if you are worsening or failing to improve in the next 3-5 days.   Use your inhaler for wheezing

## 2014-11-06 NOTE — Progress Notes (Signed)
Pre visit review using our clinic review tool, if applicable. No additional management support is needed unless otherwise documented below in the visit note. 

## 2014-11-06 NOTE — Progress Notes (Signed)
   Subjective:    [name], female    DOB: 1956-05-14, 59 y.o.   MRN: [id number]  HPI  [name] is a 59 yo female with a CC of sore throat, ear pressure, and green mucous x 1 day.   Sept. 5 bouts of pneumonia and bronchitis, chills, fever- not documented. Feeling bad since Saturday, sore throat- moderately sore- gargle with salt water. Not around strep.  Coughing and wheezing on Saturday.   x1 day. Advil- somewhat helpful  Not helpful- 3-4 x today   Review of Systems  Constitutional: Positive for fever, chills, diaphoresis and fatigue.  HENT: Positive for congestion, ear pain, postnasal drip, rhinorrhea, sneezing, sore throat and trouble swallowing. Negative for ear discharge, facial swelling and sinus pressure.   Eyes: Negative for visual disturbance.  Respiratory: Negative for chest tightness, shortness of breath and wheezing.   Cardiovascular: Negative for chest pain, palpitations and leg swelling.  Gastrointestinal: Negative for nausea, vomiting and diarrhea.  Musculoskeletal: Negative for neck pain and neck stiffness.  Skin: Negative for rash.  Neurological: Positive for headaches. Negative for dizziness and syncope.  Hematological: Does not bruise/bleed easily.       Objective:   Physical Exam  Constitutional: She is oriented to person, place, and time. She appears well-developed and well-nourished. No distress.  BP 110/70 mmHg  Pulse 64  Temp(Src) 97.5 F (36.4 C) (Oral)  Resp 12  Ht 5' 4.5" (1.638 m)  Wt 156 lb 1.9 oz (70.816 kg)  BMI 26.39 kg/m2  SpO2 97%   HENT:  Head: Normocephalic and atraumatic.  Right Ear: External ear normal.  Left Ear: External ear normal.  Cardiovascular: Normal rate, regular rhythm, normal heart sounds and intact distal pulses.  Exam reveals no gallop and no friction rub.   No murmur heard. Pulmonary/Chest: Effort normal and breath sounds normal. No respiratory distress. She has no wheezes. She has no rales. She  exhibits no tenderness.  Neurological: She is alert and oriented to person, place, and time. No cranial nerve deficit. She exhibits normal muscle tone. Coordination normal.  Skin: Skin is warm and dry. No rash noted. She is not diaphoretic.  Psychiatric: She has a normal mood and affect. Her behavior is normal. Judgment and thought content normal.      Assessment & Plan:

## 2014-11-09 DIAGNOSIS — J069 Acute upper respiratory infection, unspecified: Secondary | ICD-10-CM | POA: Insufficient documentation

## 2014-11-09 NOTE — Assessment & Plan Note (Signed)
Stable. Since this is only day 1 of symptoms will watch. Instructed pt to try benadryl at night, zyrtec during the day, Sudafed for congestion, cough syrup like Delsym, and saline nasal spray.  Call if worsening or failing to improve in the next 3-5 days. Inhaler for wheezing. FU prn worsening/failure to improve.

## 2015-01-13 ENCOUNTER — Other Ambulatory Visit: Payer: Self-pay | Admitting: *Deleted

## 2015-01-13 MED ORDER — FLUTICASONE PROPIONATE 50 MCG/ACT NA SUSP: 2.0000 | Freq: Every day | NASAL | Status: DC

## 2015-01-25 NOTE — Consult Note (Signed)
Still with nausea. Could not tolerate bowel prep. Thus, tap water enemas given this AM, since patient still wanted to proceed with colonoscopy. Colon showed diffuse inflammation and ulceration in distal transverse colon, splenic flexure, and descending colon. Multiple biopsies taken. Findings are consistent with ischemic colitis, although c.diff colitis possible. However, PCR for c.diff toxin was neg even though c.diff GDH Ag was positive. Continue cipro/flagyl for now. Full liquid diet. Advance diet as tolerated. Thanks.  Electronic Signatures: Verdie Shire (MD)  (Signed on 27-Jul-15 13:38)  Authored  Last Updated: 27-Jul-15 13:38 by Verdie Shire (MD)

## 2015-01-25 NOTE — Consult Note (Signed)
PATIENT NAME:  Brenda Burns, JHA MR#:  [id number] DATE OF BIRTH:  [birth year]  DATE OF CONSULTATION:  04/28/2014  CONSULTING PHYSICIAN:  Lupita Dawn. Candace Cruise, MD  CONSULTING PHYSICIAN: Hulen Luster, M.D.   REASON FOR REFERRAL: Abdominal pain, diarrhea, colitis.   HISTORY OF PRESENT ILLNESS: The patient is a 59 year old white female who has been complaining of nausea for the past week or so. Then, she started having crampy abdominal pain associated with abdominal fullness and diarrhea. She denied having any vomiting or coffee-ground emesis. She denied having any hematochezia or melena. She complains of having some chills, but no fever. In the Emergency Room, CT of the abdomen was done. It showed evidence of colitis affecting the splenic flexure, distal transverse colon, and descending colon. She denies any recent contact with anyone else who is sick. She has had some loss of appetite as well.   Interestingly, she has had episodes like this in the past. The first occurred in 2005 or so when she was in [place]. She did have a colonoscopy in 2006 by Dr. Sonny Masters here, which was completely normal. Ever since then she has had attacks every few years or so. There was one point where she was told she had diverticulitis, but it is unclear whether was ever this was ever confirmed or not. The only thing different this time is that the symptoms progressed over a week. In previous attacks, the symptoms occurred acutely. The patient denies any chest pain, or coughing or shortness of breath.   PAST MEDICAL HISTORY: Notable for acid reflux, asthma. Endometriosis, hyperlipidemia, she has had hysterectomy, tubal ligation and laparotomy.   ALLERGIES: SHE IS ALLERGIC TO PENICILLIN AND STADOL.   MEDICATIONS: The only medication she takes is Ventolin as needed.  SOCIAL HISTORY:  She denies any tobacco or alcohol use. There is no family history.   REVIEW OF SYSTEMS: Please refer to the initial review of symptoms that was done by the  admitting doctor.   PHYSICAL EXAMINATION: GENERAL: The patient is in no acute distress, but she looks a little washed out from feeling sick. She does feel a little better with being on antibiotics.  HEENT: Normocephalic, atraumatic head. Pupils are equally reactive. Throat is clear.  NECK: Supple.  CARDIAC: Regular rhythm and rate without murmurs.  LUNGS: Clear bilaterally.  ABDOMEN: Showed diffuse tenderness to palpation throughout. There is no rebound or guarding. There is no hepatosplenomegaly. She had active bowel sounds.  EXTREMITIES: No clubbing, cyanosis, or edema.  SKIN: Negative.  NEUROLOGICAL: Examination is nonfocal.   LABORATORY DATA: White count is 10.2, hemoglobin 14.8. Liver enzymes are normal. Sodium 139, potassium 3.5, chloride 103, BUN 15, creatinine 1.91, glucose 112. C. difficile was negative. Urinalysis was negative   CT of the abdomen showed colonic wall edema and thickening involving the distal transverse colon, splenic flexure and descending colon.   IMPRESSION: This is a patient with acute abdominal pain, diarrhea, nausea. CT scan shows evidence of colitis. Could be infectious.  Initial C. difficile was negative. Because of the distribution of the colitis we have to be concerned about ischemic colitis. There is no mention of diverticula on the CT scan to suggest diverticulitis. Agree with antibiotics for now. I agree with pain and nausea medication. Because she has had recurrent episodes like this since her last colonoscopy in 2006, I talked about repeating the colonoscopy tomorrow. It is unclear whether she will be able to drink the prep or not. She says she is cleaned out anyway.  Hopefully, she will not require a large amount of bowel prep for her to be cleaned out well.   Thank you for the referral.   ____________________________ Lupita Dawn. Candace Cruise, MD pyo:sg D: 04/29/2014 10:14:05 ET T: 04/29/2014 10:27:26 ET JOB#: [id number]  cc: Lupita Dawn. Candace Cruise, MD, <Dictator> Lupita Dawn Tammye Kahler  MD ELECTRONICALLY SIGNED 04/29/2014 14:50

## 2015-01-25 NOTE — Consult Note (Signed)
Chief Complaint:  Subjective/Chief Complaint Was able to tolerate full liquids last night. Still feels nauseous and bloated.   VITAL SIGNS/ANCILLARY NOTES: **Vital Signs.:   28-Jul-15 04:00  Vital Signs Type Routine  Temperature Temperature (F) 98.8  Celsius 37.1  Temperature Source oral  Pulse Pulse 58  Respirations Respirations 20  Systolic BP Systolic BP 91  Diastolic BP (mmHg) Diastolic BP (mmHg) 51  Mean BP 64  Pulse Ox % Pulse Ox % 95  Pulse Ox Activity Level  At rest  Oxygen Delivery Room Air/ 21 %   Brief Assessment:  GEN no acute distress   Cardiac Regular   Respiratory clear BS   Gastrointestinal mild diffuse tenderness   Lab Results: Routine Chem:  27-Jul-15 05:55   Glucose, Serum 91  BUN 7  Creatinine (comp) 0.79  Sodium, Serum 141  Potassium, Serum  3.1  Chloride, Serum 107  CO2, Serum 26  Calcium (Total), Serum  8.2  Anion Gap 8  Osmolality (calc) 279  eGFR (African American) >60  eGFR (Non-African American) >60 (eGFR values <95m/min/1.73 m2 may be an indication of chronic kidney disease (CKD). Calculated eGFR is useful in patients with stable renal function. The eGFR calculation will not be reliable in acutely ill patients when serum creatinine is changing rapidly. It is not useful in  patients on dialysis. The eGFR calculation may not be applicable to patients at the low and high extremes of body sizes, pregnant women, and vegetarians.)  Magnesium, Serum  1.6 (1.8-2.4 THERAPEUTIC RANGE: 4-7 mg/dL TOXIC: > 10 mg/dL  -----------------------)  Routine Hem:  27-Jul-15 05:55   WBC (CBC) 8.5  RBC (CBC) 4.18  Hemoglobin (CBC) 12.2  Hematocrit (CBC) 36.3  Platelet Count (CBC)  129  MCV 87  MCH 29.1  MCHC 33.5  RDW 13.2  Neutrophil % 73.4  Lymphocyte % 17.6  Monocyte % 8.0  Eosinophil % 0.7  Basophil % 0.3  Neutrophil # 6.2  Lymphocyte # 1.5  Monocyte # 0.7  Eosinophil # 0.1  Basophil # 0.0 (Result(s) reported on 29 Apr 2014 at  06:21AM.)   Assessment/Plan:  Assessment/Plan:  Assessment Likely ischemic colitis. Path pending.   Plan Contnue Abx and supportive care. Can advance to low residue diet when feels better.   Electronic Signatures: OVerdie Shire(MD)  (Signed 28-Jul-15 08:20)  Authored: Chief Complaint, VITAL SIGNS/ANCILLARY NOTES, Brief Assessment, Lab Results, Assessment/Plan   Last Updated: 28-Jul-15 08:20 by OVerdie Shire(MD)

## 2015-01-25 NOTE — Consult Note (Signed)
Pt seen and examined. Full consult to follow. Several attacks of abd pain and cramping over the years starting around 2005 in [place]. Pt had completely normal colonoscopy in 2006 here. Attacks every few years or so. This episode is similar except that pain progressed over a week or so. Some nausea. Lots of diarrhea yesterday. CT shows inflammation near splenic flexure, suggesting ischemic colitis, rather than either infectious or diverticulitis. No mention of diverticuli on CT. Agree with Abx for now. Agree with pain/nausea meds. Will plan colonoscopy tomorrow to find the cause. Will follow. THanks.  Electronic Signatures: Verdie Shire (MD)  (Signed on 26-Jul-15 11:12)  Authored  Last Updated: 26-Jul-15 11:12 by Verdie Shire (MD)

## 2015-01-25 NOTE — Discharge Summary (Signed)
PATIENT NAME:  Brenda Burns, Brenda Burns MR#:  [id number] DATE OF BIRTH:  [birth year]  DATE OF ADMISSION:  04/28/2014 DATE OF DISCHARGE:  04/30/2014  DISCHARGE DIAGNOSES:  Acute ischemic colitis, improving, tolerating diet.   SECONDARY DIAGNOSES:  1.  Gastroesophageal reflux disease.  2.  Asthma.  3.  History of diverticulitis.  4.  Endometriosis.  5.  Hyperlipidemia.   CONSULTATIONS:  Gastroenterology, Dr. Verdie Shire.   PROCEDURES AND RADIOLOGY: Colonoscopy on July 27 by Dr. Candace Cruise showed findings consistent with ischemic colitis.   CT scan of the abdomen and pelvis with contrast on July 25 showed findings consistent with colitis.   MAJOR LABORATORY PANEL: Urinalysis on admission was negative.   Stool culture was negative. Stool for C. difficile was negative along with comprehensive culture.   Colon biopsy showed ischemic colitis, no dysplasia or malignancy.   HISTORY AND SHORT HOSPITAL COURSE: The patient is a 59 year old female with the above-mentioned medical problems who was admitted for abdominal pain, nausea, vomiting and diarrhea. Please see Dr. Graciela Husbands dictated history and physical for further details. The patient was diagnosed with colitis based on her symptoms and CT scan findings.  A GI consultation was obtained with Dr. Verdie Shire who recommended colonoscopy which was performed on July 27 showing findings consistent with ischemic colitis. The patient was slowly started on a diet which was tolerated fine and she diet and was discharged home on the July 28 in stable condition.  DISCHARGE VITAL SIGNS: Temperature 98.1, heart rate 63 per minute, respirations 20 per minute, blood pressure 112/70, and her oxygen saturation was 93% on room air.   DISCHARGE PHYSICAL EXAMINATION:   CARDIOVASCULAR: S1, S2 normal. No murmurs, rubs, or gallop.  LUNGS: Clear to auscultation bilaterally. No wheezing, rales, rhonchi, or crepitation.  ABDOMEN: Soft, benign.  NEUROLOGIC: Nonfocal examination.   All  other physical examination remained at baseline.   DISCHARGE MEDICATIONS:  1.  Ventolin HFA 2 puffs inhaled every 6-8 hours as needed.  2.  Flagyl 500 mg p.o. every 8 hours for 10 days.  3.  Ciprofloxacin 500 mg p.o. b.i.d. for 10 days.   DISCHARGE DIET: Eat light meals for the first 48 hours in the form of a soft diet and then advance as tolerated.   DISCHARGE ACTIVITY: As tolerated.   DISCHARGE INSTRUCTIONS AND FOLLOWUP:  The patient was instructed to follow up with her primary care physician, Dr. Einar Pheasant, in 2-4 weeks. She will need to follow up with Dr. Verdie Shire from GI in 1-2 weeks.   TOTAL TIME DISCHARGING THIS PATIENT: 45 minutes.    ____________________________ Lucina Mellow. Manuella Ghazi, MD vss:lt D: 05/02/2014 06:54:06 ET T: 05/02/2014 07:33:02 ET JOB#: [id number]  cc: Jakhi Dishman S. Manuella Ghazi, MD, <Dictator> Lupita Dawn. Candace Cruise, MD Einar Pheasant, MD Lucina Mellow [facility] MD ELECTRONICALLY SIGNED 05/02/2014 19:51

## 2015-01-25 NOTE — H&P (Signed)
PATIENT NAME:  Brenda Burns, Brenda Burns MR#:  [id number] DATE OF BIRTH:  1956/09/19  DATE OF ADMISSION:  04/28/2014  REFERRING PHYSICIAN: Dr. Delman Kitten.   PRIMARY CARE PHYSICIAN: Dr. Einar Pheasant.   CHIEF COMPLAINT: Abdominal pain, vomiting, diarrhea.   HISTORY OF PRESENT ILLNESS: This is a 59 year old female who presents with complaints of nausea over the last week. Yesterday afternoon she started to develop a crampy abdominal pain, a sensation of fullness as well, she developed diarrhea then as well and had couple episodes of nausea. No coffee-ground emesis. No bright red blood per rectum. No melena. The patient was afebrile. No leukocytosis. In ED, she had CT abdomen and pelvis which did show evidence of colitis and splenic and descending and transverse colon. She denies any sick contact and she reports having fever and chills at home and has loss of appetite. When asked if her abdominal pain is associated with eating, reports she has not been eating since she has been having the pain, so it is unclear if it worsens with eating or not. She denies chest pain, hemoptysis as well. Hospitalist requested to admit the patient.   PAST MEDICAL HISTORY:  1. Acid reflux.  2. Asthma.  3. History of diverticulitis in the past.  4. Endometriosis.  5. Hyperlipidemia.  6. Tubal ligation.  7. Laparotomy for endometriosis.  8. Status post total abdominal hysterectomy and bilateral salpingo-oophorectomy.   ALLERGIES:  1. PENICILLIN.  2. STADOL.   HOME MEDICATIONS: Ventolin as needed.   SOCIAL HISTORY: No smoking. No alcohol. No illicit drug use.   FAMILY HISTORY: No family history of coronary artery disease at young age.   REVIEW OF SYSTEMS:   GENERAL: The patient reports fever, fatigue, chills, weakness, loss of appetite.  EYES: Denies blurry vision, double vision, inflammation. ENT: Denies tinnitus, ear pain, hearing loss, epistaxis. RESPIRATORY: Denies cough, wheezing, hemoptysis, dyspnea.    CARDIOVASCULAR: Denies chest pain, edema, palpitation, syncope.  GASTROINTESTINAL: Reports nausea, vomiting, diarrhea, abdominal pain.  She has melena, bright red blood per rectum.  GENITOURINARY: Denies dysuria, hematuria, renal colic.  ENDOCRINE: Denies polyuria, polydipsia, heat or cold intolerance.  HEMATOLOGY: Denies anemia, easy bruising, bleeding diathesis.  INTEGUMENT: Denies acne, rash, or skin lesion.  MUSCULOSKELETAL: Denies any swelling, gout, arthritis, cramps.  NEUROLOGIC: Denies CVA, TIA, vertigo, tremor, ataxia.  PSYCHIATRIC: Denies anxiety, insomnia, or depression.  PHYSICAL EXAMINATION:  VITAL SIGNS: Temperature 98.2, pulse 75, respiratory rate 16, blood pressure 128/59, saturating 95% on room air.  GENERAL: A well-nourished female, looks comfortable in bed, in no apparent distress.  HEENT: Head atraumatic, normocephalic. Pupils are equal and reactive to light. Pink conjunctivae. Anicteric sclerae. Dry oral mucosa.  NECK: Supple. No thyromegaly. No JVD.  CHEST: Good air entry bilaterally. No wheezing, rales, rhonchi.  CARDIOVASCULAR: S1, S2 heard. No rubs, murmurs, gallops.  ABDOMEN: Diffuse tenderness to palpation but no rebound, no guarding. Negative Murphy's sign. Bowel sounds present in all quadrants.  EXTREMITIES: No edema. No clubbing. No cyanosis.  SKIN: Dry. Delayed skin turgor. PSYCHIATRIC: Appropriate affect. Awake, alert x 3. Intact judgment and insight.  NEUROLOGIC: Cranial nerves grossly intact. Motor 5/5. No focal deficits.  MUSCULOSKELETAL: No joint effusion or erythema.   PERTINENT LABORATORY DATA: Glucose 112,  creatinine 0.91, sodium 139, potassium 3.5, chloride 103, CO2 of 27 ALT 26, AST 28, alkaline phosphatase 86. White blood cells 10.2, hemoglobin 14.8, hematocrit 44.3, platelets 197,000. Urinalysis negative for leukocyte esterase or nitrite.   IMAGING STUDIES: CT abdomen and pelvis showing colonic wall  edema and thickening involving the distal  transverse colon splenic flexure and ascending colon. Differential consideration would include infectious, inflammatory, or ischemic colitis.   ASSESSMENT AND PLAN:  1. Abdominal pain, nausea, vomiting, diarrhea. This is related to patient's colitis. Etiology is unclear. Will start her on IV Cipro and Flagyl for possible infectious etiology. Will obtain stool workup and cultures. As well we will consult gastrointestinal service for further evaluation at this point as other etiologies like inflammatory and ischemic need to be worked up.  2. Gastroesophageal reflux disease. Start patient on proton pump inhibitor.  3. History of asthma. Has no active wheezing. Continue with p.r.n. albuterol.  4. Deep vein thrombosis prophylaxis. Subcutaneous heparin.   CODE STATUS: Full code.   TOTAL TIME SPENT ON ADMISSION AND PATIENT CARE: 45 minutes.    ____________________________ Albertine Patricia, MD dse:lt D: 04/28/2014 01:02:44 ET T: 04/28/2014 02:37:19 ET JOB#: [id number]  cc: Albertine Patricia, MD, <Dictator> Jaksen Fiorella Graciela Husbands MD ELECTRONICALLY SIGNED 04/29/2014 7:22

## 2015-01-27 LAB — SURGICAL PATHOLOGY

## 2015-03-21 ENCOUNTER — Telehealth: Payer: Self-pay | Admitting: Internal Medicine

## 2015-03-21 ENCOUNTER — Ambulatory Visit: Payer: BC Managed Care – PPO | Admitting: Internal Medicine

## 2015-03-21 DIAGNOSIS — Z0289 Encounter for other administrative examinations: Secondary | ICD-10-CM

## 2015-03-21 NOTE — Telephone Encounter (Signed)
Patient did not come in for their appointment today for cough .  Please let me know if patient needs to be contacted immediately for follow up or no follow up needed.

## 2015-03-24 NOTE — Telephone Encounter (Signed)
No follow up needed

## 2015-03-25 ENCOUNTER — Telehealth: Payer: Self-pay | Admitting: Pulmonary Disease

## 2015-03-25 MED ORDER — DOXYCYCLINE HYCLATE 100 MG PO TABS: 100.0000 mg | ORAL_TABLET | Freq: Two times a day (BID) | ORAL | Status: DC

## 2015-03-25 MED ORDER — PREDNISONE 10 MG PO TABS
ORAL_TABLET | ORAL | Status: DC
Start: 2015-03-25 — End: 2015-04-09

## 2015-03-25 NOTE — Telephone Encounter (Signed)
Pt aware of recs. RX sent in appt scheduled for 04/10/15. Nothing further needed

## 2015-03-25 NOTE — Telephone Encounter (Signed)
PT seen at urgent care last week.  Was given zpak and pred taper.  Finished zpak yesterday and has 3 days of Prednisone left.  Still c/o prod cough (green, brown), some sob, wheezing at night.  Denies fever.  Pt going on vacation next week and doesn't want to be sick. She states that Levaquin usually helps the best.  Please advise.

## 2015-03-25 NOTE — Telephone Encounter (Signed)
Doxycycline 100mg  po bid 5 days Prednisone taper: Take 40mg  po daily for 3 days, then take 30mg  po daily for 3 days, then take 20mg  po daily for two days, then take 10mg  po daily for 2 days  Needs an office visit in the next month

## 2015-04-09 ENCOUNTER — Ambulatory Visit (INDEPENDENT_AMBULATORY_CARE_PROVIDER_SITE_OTHER): Payer: BC Managed Care – PPO | Admitting: Pulmonary Disease

## 2015-04-09 ENCOUNTER — Encounter: Payer: Self-pay | Admitting: Pulmonary Disease

## 2015-04-09 VITALS — BP 124/66 | HR 66 | Ht 65.75 in | Wt 164.0 lb

## 2015-04-09 DIAGNOSIS — J411 Mucopurulent chronic bronchitis: Secondary | ICD-10-CM | POA: Diagnosis not present

## 2015-04-09 MED ORDER — PREDNISONE 10 MG PO TABS: ORAL_TABLET | ORAL | Status: DC

## 2015-04-09 MED ORDER — DOXYCYCLINE HYCLATE 50 MG PO CAPS: 100.0000 mg | ORAL_CAPSULE | Freq: Two times a day (BID) | ORAL | Status: DC

## 2015-04-09 NOTE — Progress Notes (Signed)
Subjective:    [name], female    DOB: 08-Oct-1955, 59 y.o.   MRN: [id number]  Synopsis: 59 year old female who had a past history significant for annual episodes of bronchitis presented and early 2015 with severe persistent bronchitis and wheezing. Pulmonary function testing in February 2015 was completely normal as well as a CT scan of the chest. Only very small pulmonary nodules are seen in the right middle lobe. A chest x-ray in March 2015 showed an isolated elevated right hemidiaphragm which is a new finding compared to the February study.  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  HPI  Chief Complaint  Patient presents with  . Follow-up    l/s 12/2013.  pt states she is doing much better since last ov.  pt has no complaints at this time.  Pt is interested in having a prn prednisone for when she has flare ups.     Brenda Burns was really sick last month on June 17.  She went to urgent care and received prednisone and a zpack.  This really didn't help much, so we called in prednisone and doxycycline.  Her husband had a really bad cold before this started. She was coughing up brown colored mucus and had a fever on June 16 to 101.  She had been doing well up until this.  No real inhaler use in the last year with the exception of one time when she was working out in the yard and she needed albuterol.  She had been staying active and was doing OK.    Past Medical History  Diagnosis Date  . Chronic sinusitis with recurrent bronchitis   . Asthma   . Arthritis      Review of Systems  Constitutional: Negative for fever and fatigue.  HENT: Negative for postnasal drip, rhinorrhea and sinus pressure.   Respiratory: Negative for cough, shortness of breath and wheezing.   Cardiovascular: Negative for chest pain, palpitations and leg swelling.       Objective:   Physical Exam Filed Vitals:   04/09/15  1343  BP: 124/66  Pulse: 66  Height: 5' 5.75" (1.67 m)  Weight: 164 lb (74.39 kg)  SpO2: 95%   RA  Gen: no acute distress HEENT: NCAT, EOMi, OP clear, PULM: Clear to auscultation bilaterally CV: RRR, no mgr, no JVD AB: BS+, soft, nontender, no hsm Ext: warm, no edema, no clubbing, no cyanosis Derm: no rash or skin breakdown  06/27/2013 CXR [facility] > RLL pneumonia 10/2013 full PFT> ratio 77%, FEV1 2.89L (119% pred), no change with BD, TLC 5.22L (102% pred) DLCO 25.0 (113% pred) 10/2013 CT Chest> no ILD, few nonspecific pulmonary nodules no bigger than 3 mm in size March 2015 chest x-ray isolated elevated right hemidiaphragm with right lower lobe atelectasis     Assessment & Plan:   Bronchitis, mucopurulent recurrent She had another exacerbation of bronchitis last month but fortunately this has resolved. Her lungs are clear on exam today. 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 She is doing well today.  Plan: She was provided a prescription of prednisone as well as doxycycline to use in the future as needed if she has a recurrent episode of bronchitis I told her that if the symptoms do not improve within 3-4 days after starting these medications and she needs to be seen immediately for  a chest x-ray to rule out pneumonia Follow-up with [place] as needed    Updated Medication List Outpatient Encounter Prescriptions as of 04/09/2015  Medication Sig  . B Complex-C (B-COMPLEX WITH VITAMIN C) tablet Take 1 tablet by mouth daily.  . Calcium Carbonate-Vitamin D (CALTRATE 600+D PO) Take by mouth.  . cetirizine (ZYRTEC) 10 MG tablet Take 10 mg by mouth daily.  Marland Kitchen doxycycline (VIBRAMYCIN) 50 MG capsule Take 2 capsules (100 mg total) by mouth 2 (two) times daily.  . fluticasone (FLONASE) 50 MCG/ACT nasal spray Place 2 sprays into both nostrils daily.  . Multiple Vitamins-Minerals (CENTRUM SILVER PO) Take by mouth.  . predniSONE (DELTASONE)  10 MG tablet Take 40mg  po daily for 3 days, then take 30mg  po daily for 3 days, then take 20mg  po daily for two days, then take 10mg  po daily for 2 days  . Red Yeast Rice Extract (RED YEAST RICE PO) Take by mouth.  . [DISCONTINUED] doxycycline (VIBRA-TABS) 100 MG tablet Take 1 tablet (100 mg total) by mouth 2 (two) times daily. (Patient not taking: Reported on 04/09/2015)  . [DISCONTINUED] levofloxacin (LEVAQUIN) 500 MG tablet Take 1 tablet (500 mg total) by mouth daily. (Patient not taking: Reported on 04/09/2015)  . [DISCONTINUED] predniSONE (DELTASONE) 10 MG tablet Take 40mg  po daily for 3 days, then take 30mg  po daily for 3 days, then take 20mg  po daily for two days, then take 10mg  po daily for 2 days (Patient not taking: Reported on 04/09/2015)  . [DISCONTINUED] predniSONE (DELTASONE) 10 MG tablet Take 40mg  po daily for 3 days, then take 30mg  po daily for 3 days, then take 20mg  po daily for two days, then take 10mg  po daily for 2 days   No facility-administered encounter medications on file as of 04/09/2015.

## 2015-04-09 NOTE — Assessment & Plan Note (Signed)
She had another exacerbation of bronchitis last month but fortunately this has resolved. Her lungs are clear on exam today. 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 She is doing well today.  Plan: She was provided a prescription of prednisone as well as doxycycline to use in the future as needed if she has a recurrent episode of bronchitis I told her that if the symptoms do not improve within 3-4 days after starting these medications and she needs to be seen immediately for a chest x-ray to rule out pneumonia Follow-up with [place] as needed

## 2015-04-09 NOTE — Patient Instructions (Signed)
Take the prednisone and doxycycline if you have recurrence of the severe episode of bronchitis If your symptoms do not resolve within 3-5 days he needs to be seen and get a chest x-ray Follow-up with [place] as needed

## 2015-04-10 ENCOUNTER — Ambulatory Visit: Payer: BC Managed Care – PPO | Admitting: Pulmonary Disease

## 2015-10-07 ENCOUNTER — Other Ambulatory Visit
Admission: RE | Admit: 2015-10-07 | Discharge: 2015-10-07 | Disposition: A | Discharge status: Home / Self Care | Payer: BC Managed Care – PPO | Source: Ambulatory Visit | Attending: Internal Medicine | Admitting: Internal Medicine

## 2015-10-07 ENCOUNTER — Telehealth: Payer: Self-pay | Admitting: Internal Medicine

## 2015-10-07 ENCOUNTER — Ambulatory Visit (INDEPENDENT_AMBULATORY_CARE_PROVIDER_SITE_OTHER): Payer: BC Managed Care – PPO | Admitting: Internal Medicine

## 2015-10-07 ENCOUNTER — Encounter: Payer: Self-pay | Admitting: Internal Medicine

## 2015-10-07 VITALS — BP 132/68 | HR 68 | Ht 65.0 in | Wt 169.4 lb

## 2015-10-07 DIAGNOSIS — J4551 Severe persistent asthma with (acute) exacerbation: Secondary | ICD-10-CM | POA: Diagnosis not present

## 2015-10-07 DIAGNOSIS — Z23 Encounter for immunization: Secondary | ICD-10-CM | POA: Diagnosis not present

## 2015-10-07 DIAGNOSIS — J209 Acute bronchitis, unspecified: Secondary | ICD-10-CM

## 2015-10-07 MED ORDER — PREDNISONE 10 MG PO TABS: 10.0000 mg | ORAL_TABLET | Freq: Every day | ORAL | Status: DC

## 2015-10-07 MED ORDER — FLUTICASONE FUROATE-VILANTEROL 100-25 MCG/INH IN AEPB: 1.0000 | INHALATION_SPRAY | Freq: Every day | RESPIRATORY_TRACT | Status: AC

## 2015-10-07 MED ORDER — BENZONATATE 200 MG PO CAPS: 200.0000 mg | ORAL_CAPSULE | Freq: Three times a day (TID) | ORAL | Status: DC

## 2015-10-07 NOTE — Telephone Encounter (Signed)
Pt called back she said she does not need to come in and that she is going to her pulmonary doctor.

## 2015-10-07 NOTE — Telephone Encounter (Signed)
Pt called about having Possible bronchitis she sounds bad deep cough. Can pt be worked in? All other providers are full. Just checking. Thank You!

## 2015-10-07 NOTE — Progress Notes (Signed)
* [facility]     Assessment and Plan:  Asthma with acute exacerbation.  -Acute bilateral wheezing today, consistent with acute asthma exacerbation, likely due to acute bronchitis. -Was given a course of prednisone burst and taper over 12 days.  Acute on Chronic bronchitis. -Patient does have productive sputum, will check sputum culture. -Discussed preventative measures including influenza vaccine which she will receive today. -She is also given a sample of inhaler of Breo to be used for 7 days.  Fever -Likely due to viral upper respiratory tract infection, as above. We will check a sputum culture and start prednisone.  Cough.  -Likely due to above, acute bronchitis, given course of Tessalon for cough.   Date: 10/07/2015  MRN# [id number] Brenda Burns 01-29-1956   Brenda Burns is a 60 y.o. old female seen in follow up for chief complaint of  Chief Complaint  Patient presents with  . PULMONARY CONSULT    former BQ pt. seen for bronchitis. pt. states breathing has worsen X3d. prod. cough greenish to brown in color. wheezing. SOB. chest pain/tightness.      HPI:  She noted that about 2 weeks ago she noted tightness in chest with increasing cough and sputum production. She notes that this was typical of her flare-ups of asthma/bronchitis.  She took a previously prescribed course of doxy and prednisone which went for about 5 days. She started to feel a bit better, but then got worse about 2 days after stopping.   She notes a fever of 102 about 2 days before stopping the doxy. She has a ventolin inhaler which she has only used once. She has no maintenance inhalers as she has no symptoms in between episodes which she typically has in the summer and winter.   She has a dog which sleeps in bed with her.   Synopsis/Summary of previous medical records:  --60 year old female who had a past history significant for annual episodes of bronchitis with severe  persistent bronchitis and wheezing. --Pulmonary function testing in February 2015 was completely normal as well --CT scan of the chest 11/01/2013; Only very small pulmonary nodules are seen in the right middle lobe, these were deemed very low risk and no follow-up was necessary. --A chest x-ray in March 2015 showed an isolated elevated right hemidiaphragm which is a new finding compared to the February study and was confirmed on  a diaphragm fluoroscopy.    Medication:   Outpatient Encounter Prescriptions as of 10/07/2015  Medication Sig  . B Complex-C (B-COMPLEX WITH VITAMIN C) tablet Take 1 tablet by mouth daily.  . Calcium Carbonate-Vitamin D (CALTRATE 600+D PO) Take by mouth.  . cetirizine (ZYRTEC) 10 MG tablet Take 10 mg by mouth daily.  Marland Kitchen doxycycline (VIBRAMYCIN) 50 MG capsule Take 2 capsules (100 mg total) by mouth 2 (two) times daily.  . fluticasone (FLONASE) 50 MCG/ACT nasal spray Place 2 sprays into both nostrils daily.  . Multiple Vitamins-Minerals (CENTRUM SILVER PO) Take by mouth.  . predniSONE (DELTASONE) 10 MG tablet Take 40mg  po daily for 3 days, then take 30mg  po daily for 3 days, then take 20mg  po daily for two days, then take 10mg  po daily for 2 days  . Red Yeast Rice Extract (RED YEAST RICE PO) Take by mouth.   No facility-administered encounter medications on file as of 10/07/2015.     Allergies:  Penicillins  Review of Systems: Gen:  Denies  fever, sweats. HEENT: Denies blurred vision. Cvc:  No dizziness,  chest pain or heaviness Resp:   Positive cough and sputum porduction. Gi: Denies swallowing difficulty, stomach pain. constipation, bowel incontinence Gu:  Denies bladder incontinence, burning urine Ext:   No Joint pain, stiffness. Skin: No skin rash, easy bruising. Endoc:  No polyuria, polydipsia. Psych: No depression, insomnia. Other:  All other systems were reviewed and found to be negative other than what is mentioned in the HPI.   Physical Examination:    VS: BP 132/68 mmHg  Pulse 68  Ht 5\' 5"  (1.651 m)  Wt 169 lb 6.4 oz (76.839 kg)  BMI 28.19 kg/m2  SpO2 96%  General Appearance: No distress  Neuro:without focal findings,  speech normal,  HEENT: PERRLA, EOM intact. Pulmonary: normal breath sounds, positive scattered expiratory wheezing.   CardiovascularNormal S1,S2.  No m/r/g.   Abdomen: Benign, Soft, non-tender. Renal:  No costovertebral tenderness  GU:  Not performed at this time. Endoc: No evident thyromegaly, no signs of acromegaly. Skin:   warm, no rash. Extremities: normal, no cyanosis, clubbing.   LABORATORY PANEL:   CBC No results for input(s): WBC, HGB, HCT, PLT in the last 168 hours. ------------------------------------------------------------------------------------------------------------------  Chemistries  No results for input(s): NA, K, CL, CO2, GLUCOSE, BUN, CREATININE, CALCIUM, MG, AST, ALT, ALKPHOS, BILITOT in the last 168 hours.  Invalid input(s): GFRCGP ------------------------------------------------------------------------------------------------------------------  Cardiac Enzymes No results for input(s): TROPONINI in the last 168 hours. ------------------------------------------------------------  RADIOLOGY:   No results found for this or any previous visit. Results for orders placed during the hospital encounter of 12/24/13  DG Chest 2 View   Narrative CLINICAL DATA:  Productive cough and chest pain  EXAM: CHEST  2 VIEW  COMPARISON:  DG CHEST FLUORO dated 12/11/2013; DG CHEST 2 VIEW dated 12/03/2013; DG CHEST 2V dated 09/03/2013  FINDINGS: The lungs are mildly hyperinflated. There is no focal infiltrate. Atelectasis versus scarring just lateral to the left cardiac apex is present. There is no pleural effusion or pneumothorax. The cardiac silhouette is normal in size. The pulmonary vascularity is not engorged. The mediastinum is normal in width. The observed portions of the bony thorax  appear normal.  IMPRESSION: 1. Mild hyperinflation may reflect acute bronchitis or COPD. There is no focal pneumonia. 2. Linear vertical and horizontal densities lateral to the left cardiac apex may reflect subsegmental atelectasis or scarring. 3. There is no evidence of CHF nor pleural effusion.   Electronically Signed   By: David  Martinique   On: 12/24/2013 13:11    ------------------------------------------------------------------------------------------------------------------  Thank  you for allowing [facility] to assist in the care of your patient. Our recommendations are noted above.  Please contact us if we can be of further service.   Marda Stalker, MD.  [facility] Office Number: [phone]  Patricia Pesa, M.D.  Vilinda Boehringer, M.D.  Merton Border, M.D

## 2015-10-07 NOTE — Telephone Encounter (Signed)
Ok. I called pt back and left a vm regarding coming in today.

## 2015-10-07 NOTE — Telephone Encounter (Signed)
Brenda Burns has a 1130 today?

## 2015-10-07 NOTE — Patient Instructions (Addendum)
--  Flu shot today.  --Remove pets from bedroom.  --Check sputum culture.  --Start prednisone; 10 mg tabs x 42 tabs.  Take 6 tabs for 2 days, then Take 5 tabs for 2 days, then 4-3-2-1 (each for 2 days), then stop. --Tessalon 200 mg tid for 1 month, 1 refill.

## 2015-10-08 LAB — EXPECTORATED SPUTUM ASSESSMENT W REFEX TO RESP CULTURE

## 2015-10-08 LAB — EXPECTORATED SPUTUM ASSESSMENT W GRAM STAIN, RFLX TO RESP C

## 2015-10-11 LAB — CULTURE, RESPIRATORY

## 2015-10-11 LAB — CULTURE, RESPIRATORY W GRAM STAIN

## 2015-10-17 ENCOUNTER — Other Ambulatory Visit: Payer: Self-pay | Admitting: *Deleted

## 2015-10-17 MED ORDER — AMOXICILLIN-POT CLAVULANATE 875-125 MG PO TABS: 1.0000 | ORAL_TABLET | Freq: Two times a day (BID) | ORAL | Status: DC

## 2015-11-04 DIAGNOSIS — Z85828 Personal history of other malignant neoplasm of skin: Secondary | ICD-10-CM

## 2015-11-04 HISTORY — DX: Personal history of other malignant neoplasm of skin: Z85.828

## 2016-01-01 ENCOUNTER — Encounter: Payer: BC Managed Care – PPO | Admitting: Internal Medicine

## 2016-01-12 ENCOUNTER — Encounter: Payer: Self-pay | Admitting: Family Medicine

## 2016-01-12 ENCOUNTER — Ambulatory Visit (INDEPENDENT_AMBULATORY_CARE_PROVIDER_SITE_OTHER): Payer: BC Managed Care – PPO | Admitting: Family Medicine

## 2016-01-12 VITALS — BP 124/84 | HR 60 | Temp 97.6°F | Ht 65.0 in | Wt 168.5 lb

## 2016-01-12 DIAGNOSIS — J411 Mucopurulent chronic bronchitis: Secondary | ICD-10-CM

## 2016-01-12 MED ORDER — PREDNISONE 50 MG PO TABS: ORAL_TABLET | ORAL | Status: DC

## 2016-01-12 MED ORDER — DOXYCYCLINE HYCLATE 100 MG PO TABS: 100.0000 mg | ORAL_TABLET | Freq: Two times a day (BID) | ORAL | Status: DC

## 2016-01-12 NOTE — Patient Instructions (Signed)
Take the antibiotic and prednisone as prescribed.  Call if you worsen or fail to improve  Take care  Dr. Lacinda Axon   Acute Bronchitis Bronchitis is inflammation of the airways that extend from the windpipe into the lungs (bronchi). The inflammation often causes mucus to develop. This leads to a cough, which is the most common symptom of bronchitis.  In acute bronchitis, the condition usually develops suddenly and goes away over time, usually in a couple weeks. Smoking, allergies, and asthma can make bronchitis worse. Repeated episodes of bronchitis may cause further lung problems.  CAUSES Acute bronchitis is most often caused by the same virus that causes a cold. The virus can spread from person to person (contagious) through coughing, sneezing, and touching contaminated objects. SIGNS AND SYMPTOMS   Cough.   Fever.   Coughing up mucus.   Body aches.   Chest congestion.   Chills.   Shortness of breath.   Sore throat.  DIAGNOSIS  Acute bronchitis is usually diagnosed through a physical exam. Your health care provider will also ask you questions about your medical history. Tests, such as chest X-rays, are sometimes done to rule out other conditions.  TREATMENT  Acute bronchitis usually goes away in a couple weeks. Oftentimes, no medical treatment is necessary. Medicines are sometimes given for relief of fever or cough. Antibiotic medicines are usually not needed but may be prescribed in certain situations. In some cases, an inhaler may be recommended to help reduce shortness of breath and control the cough. A cool mist vaporizer may also be used to help thin bronchial secretions and make it easier to clear the chest.  HOME CARE INSTRUCTIONS  Get plenty of rest.   Drink enough fluids to keep your urine clear or pale yellow (unless you have a medical condition that requires fluid restriction). Increasing fluids may help thin your respiratory secretions (sputum) and reduce chest  congestion, and it will prevent dehydration.   Take medicines only as directed by your health care provider.  If you were prescribed an antibiotic medicine, finish it all even if you start to feel better.  Avoid smoking and secondhand smoke. Exposure to cigarette smoke or irritating chemicals will make bronchitis worse. If you are a smoker, consider using nicotine gum or skin patches to help control withdrawal symptoms. Quitting smoking will help your lungs heal faster.   Reduce the chances of another bout of acute bronchitis by washing your hands frequently, avoiding people with cold symptoms, and trying not to touch your hands to your mouth, nose, or eyes.   Keep all follow-up visits as directed by your health care provider.  SEEK MEDICAL CARE IF: Your symptoms do not improve after 1 week of treatment.  SEEK IMMEDIATE MEDICAL CARE IF:  You develop an increased fever or chills.   You have chest pain.   You have severe shortness of breath.  You have bloody sputum.   You develop dehydration.  You faint or repeatedly feel like you are going to pass out.  You develop repeated vomiting.  You develop a severe headache. MAKE SURE YOU:   Understand these instructions.  Will watch your condition.  Will get help right away if you are not doing well or get worse.   This information is not intended to replace advice given to you by your health care provider. Make sure you discuss any questions you have with your health care provider.   Document Released: 10/28/2004 Document Revised: 10/11/2014 Document Reviewed: 03/13/2013 Elsevier Interactive  Patient Education 2016 [employer].

## 2016-01-12 NOTE — Progress Notes (Signed)
   Subjective:  [name], female    DOB: 10-Oct-1955  Age: 60 y.o. MRN: [id number]  CC: Cough, hoarseness, sore throat  HPI:  60 year old female with a PMH of recurrent bronchitis presents with the above complaints.  Patient states that she has not been feeling well since Friday. Began with sore throat and now has progressed to severe cough.  Cough is productive of dark, discolored sputum. She also has lost her voice. No associated fever or chills. No shortness of breath. No known exacerbating or relieving factors.  Social Hx   Social History   Social History  . Marital Status: Married    Spouse Name: N/A  . Number of Children: N/A  . Years of Education: N/A   Social History Main Topics  . Smoking status: Never Smoker   . Smokeless tobacco: Never Used  . Alcohol Use: No  . Drug Use: No  . Sexual Activity: Not Asked   Other Topics Concern  . None   Social History Narrative   Review of Systems  Constitutional: Negative for fever.  HENT: Positive for sore throat and voice change.   Respiratory: Positive for cough. Negative for shortness of breath.    Objective:  BP 124/84 mmHg  Pulse 60  Temp(Src) 97.6 F (36.4 C) (Oral)  Ht 5\' 5"  (1.651 m)  Wt 168 lb 8 oz (76.431 kg)  BMI 28.04 kg/m2  SpO2 96%  BP/Weight 01/12/2016 [id number]  Systolic BP [id number] [id number]  Diastolic BP 84 68 66  Wt. (Lbs) 168.5 169.4 164  BMI 28.04 28.19 26.67    Physical Exam  Constitutional: She is oriented to person, place, and time. She appears well-developed. No distress.  HENT:  Oropharynx with mild erythema. Hoarseness appreciated.   Eyes: Conjunctivae are normal. No scleral icterus.  Neck: Neck supple.  Cardiovascular: Normal rate and regular rhythm.   Pulmonary/Chest: Effort normal. No respiratory distress. She has no wheezes. She has no rales.  Lymphadenopathy:    She has no cervical adenopathy.  Neurological: She is alert and oriented to person, place, and  time.  Vitals reviewed.  Lab Results  Component Value Date   WBC 3.6* 06/13/2014   HGB 14.2 06/13/2014   HCT 43.1 06/13/2014   PLT 195.0 06/13/2014   GLUCOSE 91 04/29/2014   CHOL 272* 06/13/2014   TRIG 122.0 06/13/2014   HDL 55.50 06/13/2014   LDLCALC 192* 06/13/2014   ALT 62* 06/13/2014   AST 42* 06/13/2014   NA 141 04/29/2014   K 3.4* 04/30/2014   CL 107 04/29/2014   CREATININE 0.79 04/29/2014   BUN 7 04/29/2014   CO2 26 04/29/2014   TSH 1.23 06/13/2014   Assessment & Plan:   Problem List Items Addressed This Visit    Bronchitis, mucopurulent recurrent ([place]) - Primary    Recurrent problem; currently having acute exacerbation. Treating with prednisone and doxycycline.         Meds ordered this encounter  Medications  . predniSONE (DELTASONE) 50 MG tablet    Sig: 1 tablet daily x 5 days.    Dispense:  5 tablet    Refill:  0  . doxycycline (VIBRA-TABS) 100 MG tablet    Sig: Take 1 tablet (100 mg total) by mouth 2 (two) times daily.    Dispense:  20 tablet    Refill:  0    Follow-up: PRN  [place]

## 2016-01-12 NOTE — Assessment & Plan Note (Addendum)
Recurrent problem; currently having acute exacerbation. Treating with prednisone and doxycycline.

## 2016-01-12 NOTE — Progress Notes (Signed)
Pre visit review using our clinic review tool, if applicable. No additional management support is needed unless otherwise documented below in the visit note. 

## 2016-02-20 ENCOUNTER — Telehealth: Payer: Self-pay | Admitting: *Deleted

## 2016-02-25 ENCOUNTER — Other Ambulatory Visit: Payer: Self-pay | Admitting: Internal Medicine

## 2016-02-25 ENCOUNTER — Encounter: Payer: BC Managed Care – PPO | Admitting: Internal Medicine

## 2016-02-25 NOTE — Telephone Encounter (Signed)
Okay to refill? Pt hasn't seen you since 06/2014. It looks as though she has seen Dr. Lacinda Axon recently & Morey Hummingbird.

## 2016-02-26 NOTE — Telephone Encounter (Signed)
Schedule a f/u appt (or physical) with me.  Ok refill x 1.

## 2016-04-02 ENCOUNTER — Other Ambulatory Visit: Payer: Self-pay | Admitting: Internal Medicine

## 2016-04-02 NOTE — Telephone Encounter (Signed)
I sent in rx for flonase.  She has an appt in 05/2016.  Notify that she needs to keep this appt.

## 2016-04-02 NOTE — Telephone Encounter (Signed)
NO appointment with Primary since 9/15 ok to fill?

## 2016-05-11 ENCOUNTER — Encounter: Payer: Self-pay | Admitting: Internal Medicine

## 2016-05-11 ENCOUNTER — Ambulatory Visit (INDEPENDENT_AMBULATORY_CARE_PROVIDER_SITE_OTHER): Payer: BC Managed Care – PPO | Admitting: Internal Medicine

## 2016-05-11 VITALS — BP 110/74 | HR 56 | Temp 98.2°F | Resp 14 | Ht 64.25 in | Wt 167.0 lb

## 2016-05-11 DIAGNOSIS — Z0000 Encounter for general adult medical examination without abnormal findings: Secondary | ICD-10-CM | POA: Diagnosis not present

## 2016-05-11 DIAGNOSIS — E7800 Pure hypercholesterolemia, unspecified: Secondary | ICD-10-CM

## 2016-05-11 DIAGNOSIS — D696 Thrombocytopenia, unspecified: Secondary | ICD-10-CM

## 2016-05-11 DIAGNOSIS — J40 Bronchitis, not specified as acute or chronic: Secondary | ICD-10-CM

## 2016-05-11 DIAGNOSIS — Z1239 Encounter for other screening for malignant neoplasm of breast: Secondary | ICD-10-CM | POA: Diagnosis not present

## 2016-05-11 DIAGNOSIS — R7989 Other specified abnormal findings of blood chemistry: Secondary | ICD-10-CM | POA: Diagnosis not present

## 2016-05-11 DIAGNOSIS — R945 Abnormal results of liver function studies: Secondary | ICD-10-CM

## 2016-05-11 MED ORDER — PREDNISONE 10 MG PO TABS: ORAL_TABLET | ORAL | 0 refills | Status: DC

## 2016-05-11 MED ORDER — DOXYCYCLINE HYCLATE 100 MG PO TABS: 100.0000 mg | ORAL_TABLET | Freq: Two times a day (BID) | ORAL | 0 refills | Status: DC

## 2016-05-11 NOTE — Progress Notes (Signed)
[name], female   DOB: 1956-02-25, 60 y.o.   MRN: [id number]   Subjective:    [name], female    DOB: 1956/05/22, 60 y.o.   MRN: [id number]  HPI  Patient here for a physical exam.  Have not seen her since 2015.  She has problems with reoccurring bronchitis.  Has been evaluated and worked up by pulmonary.  See Dr Anastasia Pall note.  She is currently doing well.  Ask for rx for prednisone and doxycycline to have if needed.  No chest pain.  No sob.  No acid reflux.  No abdominal pain or cramping.  Bowels stable. Discussed her cholesterol.  Discussed diet and exercise.  She plans to start water aerobics.     Past Medical History:  Diagnosis Date  . Arthritis   . Asthma   . Chronic sinusitis with recurrent bronchitis    Past Surgical History:  Procedure Laterality Date  . ABDOMINAL HYSTERECTOMY  2004   endometriosis  . TONSILLECTOMY  1060's   Family History  Problem Relation Age of Onset  . COPD Mother   . Cancer Mother     breast and colon  . Diabetes Paternal Grandfather    Social History   Social History  . Marital status: Married    Spouse name: N/A  . Number of children: N/A  . Years of education: N/A   Social History Main Topics  . Smoking status: Never Smoker  . Smokeless tobacco: Never Used  . Alcohol use Yes     Comment: glass of wine occasionally  . Drug use: No  . Sexual activity: Yes    Birth control/ protection: None   Other Topics Concern  . None   Social History Narrative  . None    Outpatient Encounter Prescriptions as of 05/11/2016  Medication Sig  . B Complex-C (B-COMPLEX WITH VITAMIN C) tablet Take 1 tablet by mouth daily.  . Calcium Carbonate-Vitamin D (CALTRATE 600+D PO) Take by mouth.  . cetirizine (ZYRTEC) 10 MG tablet Take 10 mg by mouth daily.  . Multiple Vitamins-Minerals (CENTRUM SILVER PO) Take by mouth.  . Red Yeast Rice Extract (RED YEAST RICE PO) Take by mouth.  . doxycycline (VIBRA-TABS) 100 MG  tablet Take 1 tablet (100 mg total) by mouth 2 (two) times daily.  . fluticasone (FLONASE) 50 MCG/ACT nasal spray USE 2 SPRAYS IN EACH NOSTRIL DAILY (Patient not taking: Reported on 05/11/2016)  . Fluticasone Furoate-Vilanterol (BREO ELLIPTA) 100-25 MCG/INH AEPB Inhale 1 puff into the lungs daily. (Patient not taking: Reported on 05/11/2016)  . predniSONE (DELTASONE) 10 MG tablet Take 6 tablets x 1 day and then decrease by 1/2 tablet per day until down to zero mg.  . [DISCONTINUED] albuterol (VENTOLIN HFA) 108 (90 Base) MCG/ACT inhaler Inhale 2 puffs into the lungs every 6 (six) hours as needed for wheezing or shortness of breath. PLEASE CALL OFFICE SOON FOR AN APPT  . [DISCONTINUED] doxycycline (VIBRA-TABS) 100 MG tablet Take 1 tablet (100 mg total) by mouth 2 (two) times daily.  . [DISCONTINUED] predniSONE (DELTASONE) 50 MG tablet 1 tablet daily x 5 days.   No facility-administered encounter medications on file as of 05/11/2016.     Review of Systems  Constitutional: Negative for appetite change and unexpected weight change.  HENT: Negative for congestion and sinus pressure.   Eyes: Negative for pain and visual disturbance.  Respiratory: Negative for cough, chest tightness and shortness of breath.   Cardiovascular: Negative for  chest pain, palpitations and leg swelling.  Gastrointestinal: Negative for abdominal pain, diarrhea, nausea and vomiting.  Genitourinary: Negative for difficulty urinating and dysuria.  Musculoskeletal: Negative for back pain and joint swelling.  Skin: Negative for color change and rash.  Neurological: Negative for dizziness, light-headedness and headaches.  Hematological: Negative for adenopathy. Does not bruise/bleed easily.  Psychiatric/Behavioral: Negative for agitation and dysphoric mood.       Objective:    Physical Exam  Constitutional: She is oriented to person, place, and time. She appears well-developed and well-nourished. No distress.  HENT:  Nose: Nose  normal.  Mouth/Throat: Oropharynx is clear and moist.  Eyes: Right eye exhibits no discharge. Left eye exhibits no discharge. No scleral icterus.  Neck: Neck supple. No thyromegaly present.  Cardiovascular: Normal rate and regular rhythm.   Pulmonary/Chest: Breath sounds normal. No accessory muscle usage. No tachypnea. No respiratory distress. She has no decreased breath sounds. She has no wheezes. She has no rhonchi. Right breast exhibits no inverted nipple, no mass, no nipple discharge and no tenderness (no axillary adenopathy). Left breast exhibits no inverted nipple, no mass, no nipple discharge and no tenderness (no axilarry adenopathy).  Abdominal: Soft. Bowel sounds are normal. There is no tenderness.  Musculoskeletal: She exhibits no edema or tenderness.  Lymphadenopathy:    She has no cervical adenopathy.  Neurological: She is alert and oriented to person, place, and time.  Skin: Skin is warm. No rash noted. No erythema.  Psychiatric: She has a normal mood and affect. Her behavior is normal.    BP 110/74   Pulse (!) 56   Temp 98.2 F (36.8 C)   Resp 14   Ht 5' 4.25" (1.632 m)   Wt 167 lb (75.8 kg)   BMI 28.44 kg/m  Wt Readings from Last 3 Encounters:  05/11/16 167 lb (75.8 kg)  01/12/16 168 lb 8 oz (76.4 kg)  10/07/15 169 lb 6.4 oz (76.8 kg)     Lab Results  Component Value Date   WBC 3.6 (L) 06/13/2014   HGB 14.2 06/13/2014   HCT 43.1 06/13/2014   PLT 195.0 06/13/2014   GLUCOSE 91 04/29/2014   CHOL 272 (H) 06/13/2014   TRIG 122.0 06/13/2014   HDL 55.50 06/13/2014   LDLCALC 192 (H) 06/13/2014   ALT 62 (H) 06/13/2014   AST 42 (H) 06/13/2014   NA 141 04/29/2014   K 3.4 (L) 04/30/2014   CL 107 04/29/2014   CREATININE 0.79 04/29/2014   BUN 7 04/29/2014   CO2 26 04/29/2014   TSH 1.23 06/13/2014       Assessment & Plan:   Problem List Items Addressed This Visit    Abnormal liver function tests    Recheck liver panel with next labs.        Relevant  Orders   Hepatic function panel   Frequent episodes of bronchitis    Has been worked up by pulmonary.  See Dr Anastasia Pall note.  Currently dong well.  rx given for doxycycline and prednisone to have if needed.  Follow closely.  She knows not to take unless absolutely needed.  She also knows that if symptoms do not improve, she has to be evaluated.        Healthcare maintenance    Physical today 05/11/16.  PAP 2015 ok.  Schedule mammogram.  Overdue.  Colonoscopy 08/01/14 - normal.        Hypercholesterolemia    History of elevated cholesterol.  Has tried previous statin.  Intolerance.  Recheck lipid panel.  Low cholesterol diet and exercise.  Follow.       Relevant Orders   TSH   Lipid panel   Basic metabolic panel   Thrombocytopenia, unspecified (HCC)    Recheck cbc with next labs.        Relevant Orders   CBC with Differential/Platelet    Other Visit Diagnoses    Routine general medical examination at a health care facility    -  Primary   Breast cancer screening       Relevant Orders   MM DIGITAL SCREENING BILATERAL       Einar Pheasant, MD

## 2016-05-11 NOTE — Progress Notes (Signed)
Pre visit review using our clinic review tool, if applicable. No additional management support is needed unless otherwise documented below in the visit note. 

## 2016-05-12 ENCOUNTER — Encounter: Payer: Self-pay | Admitting: Internal Medicine

## 2016-05-12 DIAGNOSIS — J209 Acute bronchitis, unspecified: Secondary | ICD-10-CM | POA: Insufficient documentation

## 2016-05-12 DIAGNOSIS — E7800 Pure hypercholesterolemia, unspecified: Secondary | ICD-10-CM | POA: Insufficient documentation

## 2016-05-12 DIAGNOSIS — Z0000 Encounter for general adult medical examination without abnormal findings: Secondary | ICD-10-CM | POA: Insufficient documentation

## 2016-05-12 NOTE — Assessment & Plan Note (Signed)
Physical today 05/11/16.  PAP 2015 ok.  Schedule mammogram.  Overdue.  Colonoscopy 08/01/14 - normal.

## 2016-05-12 NOTE — Assessment & Plan Note (Signed)
History of elevated cholesterol.  Has tried previous statin.  Intolerance.  Recheck lipid panel.  Low cholesterol diet and exercise.  Follow.

## 2016-05-12 NOTE — Assessment & Plan Note (Signed)
Recheck liver panel with next labs.

## 2016-05-12 NOTE — Assessment & Plan Note (Signed)
Recheck cbc with next labs.   

## 2016-05-12 NOTE — Assessment & Plan Note (Signed)
Has been worked up by pulmonary.  See Dr Anastasia Pall note.  Currently dong well.  rx given for doxycycline and prednisone to have if needed.  Follow closely.  She knows not to take unless absolutely needed.  She also knows that if symptoms do not improve, she has to be evaluated.

## 2016-05-17 ENCOUNTER — Other Ambulatory Visit (INDEPENDENT_AMBULATORY_CARE_PROVIDER_SITE_OTHER): Payer: BC Managed Care – PPO

## 2016-05-17 DIAGNOSIS — E7800 Pure hypercholesterolemia, unspecified: Secondary | ICD-10-CM

## 2016-05-17 DIAGNOSIS — D696 Thrombocytopenia, unspecified: Secondary | ICD-10-CM

## 2016-05-17 DIAGNOSIS — R7989 Other specified abnormal findings of blood chemistry: Secondary | ICD-10-CM

## 2016-05-17 DIAGNOSIS — R945 Abnormal results of liver function studies: Secondary | ICD-10-CM

## 2016-05-17 LAB — CBC WITH DIFFERENTIAL/PLATELET
BASOS ABS: 0 10*3/uL (ref 0.0–0.1)
BASOS PCT: 0.7 % (ref 0.0–3.0)
Eosinophils Absolute: 0.2 10*3/uL (ref 0.0–0.7)
Eosinophils Relative: 6.5 % — ABNORMAL HIGH (ref 0.0–5.0)
HEMATOCRIT: 41.7 % (ref 36.0–46.0)
Hemoglobin: 14.1 g/dL (ref 12.0–15.0)
LYMPHS PCT: 41.1 % (ref 12.0–46.0)
Lymphs Abs: 1.5 10*3/uL (ref 0.7–4.0)
MCHC: 33.8 g/dL (ref 30.0–36.0)
MCV: 85.8 fl (ref 78.0–100.0)
MONOS PCT: 8.1 % (ref 3.0–12.0)
Monocytes Absolute: 0.3 10*3/uL (ref 0.1–1.0)
NEUTROS ABS: 1.6 10*3/uL (ref 1.4–7.7)
Neutrophils Relative %: 43.6 % (ref 43.0–77.0)
PLATELETS: 187 10*3/uL (ref 150.0–400.0)
RBC: 4.86 Mil/uL (ref 3.87–5.11)
RDW: 13.9 % (ref 11.5–15.5)
WBC: 3.7 10*3/uL — ABNORMAL LOW (ref 4.0–10.5)

## 2016-05-17 LAB — BASIC METABOLIC PANEL
BUN: 19 mg/dL (ref 6–23)
CHLORIDE: 105 meq/L (ref 96–112)
CO2: 29 mEq/L (ref 19–32)
Calcium: 9.6 mg/dL (ref 8.4–10.5)
Creatinine, Ser: 0.72 mg/dL (ref 0.40–1.20)
GFR: 87.18 mL/min (ref >60.00)
Glucose, Bld: 89 mg/dL (ref 70–99)
POTASSIUM: 4.2 meq/L (ref 3.5–5.1)
SODIUM: 140 meq/L (ref 135–145)

## 2016-05-17 LAB — LIPID PANEL
CHOL/HDL RATIO: 4
Cholesterol: 253 mg/dL — ABNORMAL HIGH (ref 0–200)
HDL: 60.9 mg/dL (ref >39.00)
LDL CALC: 165 mg/dL — AB (ref 0–99)
NonHDL: 191.61
TRIGLYCERIDES: 132 mg/dL (ref 0.0–149.0)
VLDL: 26.4 mg/dL (ref 0.0–40.0)

## 2016-05-17 LAB — HEPATIC FUNCTION PANEL
ALT: 16 U/L (ref 0–35)
AST: 19 U/L (ref 0–37)
Albumin: 4.2 g/dL (ref 3.5–5.2)
Alkaline Phosphatase: 87 U/L (ref 39–117)
BILIRUBIN DIRECT: 0.1 mg/dL (ref 0.0–0.3)
TOTAL PROTEIN: 6.8 g/dL (ref 6.0–8.3)
Total Bilirubin: 0.3 mg/dL (ref 0.2–1.2)

## 2016-05-17 LAB — TSH: TSH: 2.53 u[IU]/mL (ref 0.35–4.50)

## 2016-05-20 ENCOUNTER — Other Ambulatory Visit: Payer: Self-pay | Admitting: Internal Medicine

## 2016-05-20 ENCOUNTER — Other Ambulatory Visit: Payer: Self-pay

## 2016-05-20 DIAGNOSIS — E7800 Pure hypercholesterolemia, unspecified: Secondary | ICD-10-CM

## 2016-05-20 MED ORDER — ROSUVASTATIN CALCIUM 5 MG PO TABS: 5.0000 mg | ORAL_TABLET | ORAL | 3 refills | Status: DC

## 2016-05-20 MED ORDER — ROSUVASTATIN CALCIUM 5 MG PO TABS: ORAL_TABLET | ORAL | 1 refills | Status: DC

## 2016-05-20 NOTE — Telephone Encounter (Signed)
Medication filled due to result note per Dr. Nicki Reaper.

## 2016-05-21 ENCOUNTER — Other Ambulatory Visit: Payer: BC Managed Care – PPO

## 2016-05-21 ENCOUNTER — Telehealth: Payer: Self-pay | Admitting: Internal Medicine

## 2016-05-21 DIAGNOSIS — D729 Disorder of white blood cells, unspecified: Secondary | ICD-10-CM

## 2016-05-21 DIAGNOSIS — E7800 Pure hypercholesterolemia, unspecified: Secondary | ICD-10-CM

## 2016-05-21 NOTE — Telephone Encounter (Signed)
Ok. Thank you.

## 2016-05-21 NOTE — Telephone Encounter (Signed)
Pt called about needing to have her cholesterol re checked when she started her medication of rosuvastatin (CRESTOR) 5 MG tablet. Need order please and thank you! Does pt need her CBC re checked as well?   Call pt @ [phone]. Thank you!

## 2016-05-21 NOTE — Telephone Encounter (Signed)
CBC and Hepatic function are in chart just need a six week lab appointment  From 05/17/16

## 2016-06-15 ENCOUNTER — Other Ambulatory Visit: Payer: Self-pay | Admitting: Internal Medicine

## 2016-07-02 ENCOUNTER — Other Ambulatory Visit (INDEPENDENT_AMBULATORY_CARE_PROVIDER_SITE_OTHER): Payer: BC Managed Care – PPO

## 2016-07-02 ENCOUNTER — Telehealth: Payer: Self-pay

## 2016-07-02 DIAGNOSIS — D72829 Elevated white blood cell count, unspecified: Secondary | ICD-10-CM | POA: Diagnosis not present

## 2016-07-02 DIAGNOSIS — E7800 Pure hypercholesterolemia, unspecified: Secondary | ICD-10-CM

## 2016-07-02 LAB — HEPATIC FUNCTION PANEL
ALT: 22 U/L (ref 0–35)
AST: 21 U/L (ref 0–37)
Albumin: 4.4 g/dL (ref 3.5–5.2)
Alkaline Phosphatase: 112 U/L (ref 39–117)
BILIRUBIN TOTAL: 0.5 mg/dL (ref 0.2–1.2)
Bilirubin, Direct: 0 mg/dL (ref 0.0–0.3)
TOTAL PROTEIN: 7.7 g/dL (ref 6.0–8.3)

## 2016-07-02 LAB — CBC WITH DIFFERENTIAL/PLATELET
BASOS ABS: 0 10*3/uL (ref 0.0–0.1)
BASOS PCT: 0.5 % (ref 0.0–3.0)
EOS ABS: 0.2 10*3/uL (ref 0.0–0.7)
Eosinophils Relative: 6.4 % — ABNORMAL HIGH (ref 0.0–5.0)
HCT: 44.2 % (ref 36.0–46.0)
HEMOGLOBIN: 14.9 g/dL (ref 12.0–15.0)
Lymphocytes Relative: 34 % (ref 12.0–46.0)
Lymphs Abs: 1.1 10*3/uL (ref 0.7–4.0)
MCHC: 33.8 g/dL (ref 30.0–36.0)
MCV: 86.7 fl (ref 78.0–100.0)
MONO ABS: 0.2 10*3/uL (ref 0.1–1.0)
Monocytes Relative: 6.4 % (ref 3.0–12.0)
Neutro Abs: 1.7 10*3/uL (ref 1.4–7.7)
Neutrophils Relative %: 52.7 % (ref 43.0–77.0)
PLATELETS: 214 10*3/uL (ref 150.0–400.0)
RBC: 5.1 Mil/uL (ref 3.87–5.11)
RDW: 14.2 % (ref 11.5–15.5)
WBC: 4 10*3/uL (ref 4.0–10.5)

## 2016-07-02 NOTE — Addendum Note (Signed)
Addended by: Leeanne Rio on: 07/02/2016 11:27 AM   Modules accepted: Orders

## 2016-07-02 NOTE — Telephone Encounter (Signed)
Pt came for lab draw this morning. There was an order placed for HFP, but pt said she is also needing her lipids and a CBC done. No orders for lipid or cbc in Epic. There is an expired order for a cbc from 2015, but no updated order. Please advise on placing orders. Thank you.

## 2016-07-02 NOTE — Telephone Encounter (Signed)
No lipid needed.  She just had in August.  Too soon.  Cbc and liver are in system.

## 2016-07-05 ENCOUNTER — Other Ambulatory Visit: Payer: Self-pay | Admitting: Internal Medicine

## 2016-07-05 ENCOUNTER — Telehealth: Payer: Self-pay | Admitting: Internal Medicine

## 2016-07-05 DIAGNOSIS — E7800 Pure hypercholesterolemia, unspecified: Secondary | ICD-10-CM

## 2016-07-05 NOTE — Telephone Encounter (Signed)
Pt called back returning your call. Thank you! °

## 2016-07-05 NOTE — Progress Notes (Signed)
Orders placed for f/u labs.  

## 2016-08-02 ENCOUNTER — Other Ambulatory Visit: Payer: Self-pay | Admitting: Internal Medicine

## 2016-08-02 ENCOUNTER — Ambulatory Visit
Admission: RE | Admit: 2016-08-02 | Discharge: 2016-08-02 | Disposition: A | Discharge status: Home / Self Care | Payer: BC Managed Care – PPO | Source: Ambulatory Visit | Attending: Internal Medicine | Admitting: Internal Medicine

## 2016-08-02 DIAGNOSIS — Z1239 Encounter for other screening for malignant neoplasm of breast: Secondary | ICD-10-CM

## 2016-08-02 DIAGNOSIS — Z1231 Encounter for screening mammogram for malignant neoplasm of breast: Secondary | ICD-10-CM | POA: Insufficient documentation

## 2016-09-02 ENCOUNTER — Other Ambulatory Visit: Payer: BC Managed Care – PPO

## 2016-09-13 ENCOUNTER — Other Ambulatory Visit (INDEPENDENT_AMBULATORY_CARE_PROVIDER_SITE_OTHER): Payer: BC Managed Care – PPO

## 2016-09-13 DIAGNOSIS — E7800 Pure hypercholesterolemia, unspecified: Secondary | ICD-10-CM | POA: Diagnosis not present

## 2016-09-13 LAB — COMPREHENSIVE METABOLIC PANEL
ALK PHOS: 89 U/L (ref 39–117)
ALT: 20 U/L (ref 0–35)
AST: 21 U/L (ref 0–37)
Albumin: 4.6 g/dL (ref 3.5–5.2)
BILIRUBIN TOTAL: 0.3 mg/dL (ref 0.2–1.2)
BUN: 20 mg/dL (ref 6–23)
CO2: 28 meq/L (ref 19–32)
CREATININE: 0.71 mg/dL (ref 0.40–1.20)
Calcium: 9.6 mg/dL (ref 8.4–10.5)
Chloride: 103 mEq/L (ref 96–112)
GFR: 88.5 mL/min (ref >60.00)
GLUCOSE: 85 mg/dL (ref 70–99)
Potassium: 4.6 mEq/L (ref 3.5–5.1)
SODIUM: 138 meq/L (ref 135–145)
TOTAL PROTEIN: 7.3 g/dL (ref 6.0–8.3)

## 2016-09-13 LAB — LIPID PANEL
CHOL/HDL RATIO: 3
Cholesterol: 216 mg/dL — ABNORMAL HIGH (ref 0–200)
HDL: 64.8 mg/dL (ref >39.00)
LDL Cholesterol: 127 mg/dL — ABNORMAL HIGH (ref 0–99)
NONHDL: 151.18
Triglycerides: 122 mg/dL (ref 0.0–149.0)
VLDL: 24.4 mg/dL (ref 0.0–40.0)

## 2016-11-10 ENCOUNTER — Ambulatory Visit: Payer: BC Managed Care – PPO | Admitting: Internal Medicine

## 2017-01-18 ENCOUNTER — Ambulatory Visit: Payer: BC Managed Care – PPO | Admitting: Internal Medicine

## 2017-04-28 ENCOUNTER — Ambulatory Visit: Payer: BC Managed Care – PPO | Admitting: Internal Medicine

## 2017-06-27 ENCOUNTER — Other Ambulatory Visit: Payer: Self-pay | Admitting: Internal Medicine

## 2017-07-13 ENCOUNTER — Ambulatory Visit (INDEPENDENT_AMBULATORY_CARE_PROVIDER_SITE_OTHER): Payer: BC Managed Care – PPO | Admitting: Internal Medicine

## 2017-07-13 ENCOUNTER — Encounter: Payer: Self-pay | Admitting: Internal Medicine

## 2017-07-13 VITALS — BP 130/78 | HR 58 | Temp 98.6°F | Resp 12 | Wt 164.8 lb

## 2017-07-13 DIAGNOSIS — E7800 Pure hypercholesterolemia, unspecified: Secondary | ICD-10-CM | POA: Diagnosis not present

## 2017-07-13 DIAGNOSIS — Z1231 Encounter for screening mammogram for malignant neoplasm of breast: Secondary | ICD-10-CM | POA: Diagnosis not present

## 2017-07-13 DIAGNOSIS — D696 Thrombocytopenia, unspecified: Secondary | ICD-10-CM | POA: Diagnosis not present

## 2017-07-13 DIAGNOSIS — J411 Mucopurulent chronic bronchitis: Secondary | ICD-10-CM | POA: Diagnosis not present

## 2017-07-13 DIAGNOSIS — Z8249 Family history of ischemic heart disease and other diseases of the circulatory system: Secondary | ICD-10-CM | POA: Diagnosis not present

## 2017-07-13 DIAGNOSIS — Z23 Encounter for immunization: Secondary | ICD-10-CM

## 2017-07-13 DIAGNOSIS — R7989 Other specified abnormal findings of blood chemistry: Secondary | ICD-10-CM

## 2017-07-13 DIAGNOSIS — Z1239 Encounter for other screening for malignant neoplasm of breast: Secondary | ICD-10-CM

## 2017-07-13 DIAGNOSIS — R945 Abnormal results of liver function studies: Secondary | ICD-10-CM | POA: Diagnosis not present

## 2017-07-13 DIAGNOSIS — J309 Allergic rhinitis, unspecified: Secondary | ICD-10-CM | POA: Diagnosis not present

## 2017-07-13 MED ORDER — DOXYCYCLINE HYCLATE 100 MG PO TABS: 100.0000 mg | ORAL_TABLET | Freq: Two times a day (BID) | ORAL | 0 refills | Status: DC

## 2017-07-13 MED ORDER — PREDNISONE 10 MG PO TABS: ORAL_TABLET | ORAL | 0 refills | Status: DC

## 2017-07-13 MED ORDER — ALBUTEROL SULFATE HFA 108 (90 BASE) MCG/ACT IN AERS: 2.0000 | INHALATION_SPRAY | Freq: Four times a day (QID) | RESPIRATORY_TRACT | 2 refills | Status: DC | PRN

## 2017-07-13 NOTE — Patient Instructions (Signed)
Examples of probiotics:  Culturelle, florastor, align or [employer].     Aortic ultrasound - diagnosis would be family history of aneurysm

## 2017-07-13 NOTE — Progress Notes (Signed)
[name], female   DOB: [birth year], 61 y.o.   MRN: [id number]   Subjective:    [name], female    DOB: 1955-12-02, 61 y.o.   MRN: [id number]  HPI  Patient here for a scheduled follow up.  She reports she is doing relatively well.  Has a history of vertigo.  Intermittent for years.  States has noticed some minimal vertigo recently.  States notices with certain movements, i.e., if bends over and comes up quickly.  She feels some of this is related to allergy and sinus issues.  Plans to start taking zyrtec regularly.  This helps.  Also using flonase.  No headache.  No significant dizziness.  No chest pain.  No sob.  No acid reflux.  No abdominal pain.  Bowels moving.  No urine change.     Past Medical History:  Diagnosis Date  . Arthritis   . Asthma   . Chronic sinusitis with recurrent bronchitis    Past Surgical History:  Procedure Laterality Date  . ABDOMINAL HYSTERECTOMY  2004   endometriosis  . TONSILLECTOMY  1060's   Family History  Problem Relation Age of Onset  . COPD Mother   . Cancer Mother        breast and colon  . Breast cancer Mother 18  . Diabetes Paternal Grandfather    Social History   Social History  . Marital status: Married    Spouse name: N/A  . Number of children: N/A  . Years of education: N/A   Social History Main Topics  . Smoking status: Never Smoker  . Smokeless tobacco: Never Used  . Alcohol use Yes     Comment: glass of wine occasionally  . Drug use: No  . Sexual activity: Yes    Birth control/ protection: None   Other Topics Concern  . None   Social History Narrative  . None    Outpatient Encounter Prescriptions as of 07/13/2017  Medication Sig  . B Complex-C (B-COMPLEX WITH VITAMIN C) tablet Take 1 tablet by mouth daily.  . Calcium Carbonate-Vitamin D (CALTRATE 600+D PO) Take by mouth.  . cetirizine (ZYRTEC) 10 MG tablet Take 10 mg by mouth daily.  Marland Kitchen doxycycline (VIBRA-TABS) 100 MG tablet Take  1 tablet (100 mg total) by mouth 2 (two) times daily.  . fluticasone (FLONASE) 50 MCG/ACT nasal spray USE 2 SPRAYS IN EACH NOSTRIL DAILY  . Multiple Vitamins-Minerals (CENTRUM SILVER PO) Take by mouth.  . predniSONE (DELTASONE) 10 MG tablet Take 6 tablets x 1 day and then decrease by 1/2 tablet per day until down to zero mg.  . Red Yeast Rice Extract (RED YEAST RICE PO) Take by mouth.  . [DISCONTINUED] doxycycline (VIBRA-TABS) 100 MG tablet Take 1 tablet (100 mg total) by mouth 2 (two) times daily.  . [DISCONTINUED] predniSONE (DELTASONE) 10 MG tablet Take 6 tablets x 1 day and then decrease by 1/2 tablet per day until down to zero mg.  . albuterol (PROVENTIL HFA;VENTOLIN HFA) 108 (90 Base) MCG/ACT inhaler Inhale 2 puffs into the lungs every 6 (six) hours as needed for wheezing or shortness of breath.  . rosuvastatin (CRESTOR) 5 MG tablet Take one tablet q Monday, Wednesday and Friday. (Patient not taking: Reported on 07/13/2017)   No facility-administered encounter medications on file as of 07/13/2017.     Review of Systems  Constitutional: Negative for appetite change and unexpected weight change.  HENT: Negative for sinus pressure.  Some allergy symptoms.    Respiratory: Negative for cough, chest tightness and shortness of breath.   Cardiovascular: Negative for chest pain, palpitations and leg swelling.  Gastrointestinal: Negative for abdominal distention, diarrhea, nausea and vomiting.  Genitourinary: Negative for difficulty urinating and dysuria.  Musculoskeletal: Negative for joint swelling and myalgias.  Skin: Negative for color change and rash.  Neurological: Positive for dizziness. Negative for headaches.  Psychiatric/Behavioral: Negative for agitation and dysphoric mood.       Objective:    Physical Exam  Constitutional: She appears well-developed and well-nourished. No distress.  HENT:  Nose: Nose normal.  Mouth/Throat: Oropharynx is clear and moist.  Neck: Neck  supple. No thyromegaly present.  Cardiovascular: Normal rate and regular rhythm.   Pulmonary/Chest: Breath sounds normal. No respiratory distress. She has no wheezes.  Abdominal: Soft. Bowel sounds are normal. There is no tenderness.  Musculoskeletal: She exhibits no edema or tenderness.  Lymphadenopathy:    She has no cervical adenopathy.  Skin: No rash noted. No erythema.  Psychiatric: She has a normal mood and affect. Her behavior is normal.    BP 130/78 (BP Location: Left Arm, Patient Position: Sitting, Cuff Size: Normal)   Pulse (!) 58   Temp 98.6 F (37 C) (Oral)   Resp 12   Wt 164 lb 12.8 oz (74.8 kg)   SpO2 96%   BMI 28.07 kg/m  Wt Readings from Last 3 Encounters:  07/13/17 164 lb 12.8 oz (74.8 kg)  05/11/16 167 lb (75.8 kg)  01/12/16 168 lb 8 oz (76.4 kg)     Lab Results  Component Value Date   WBC 4.0 07/02/2016   HGB 14.9 07/02/2016   HCT 44.2 07/02/2016   PLT 214.0 07/02/2016   GLUCOSE 85 09/13/2016   CHOL 216 (H) 09/13/2016   TRIG 122.0 09/13/2016   HDL 64.80 09/13/2016   LDLCALC 127 (H) 09/13/2016   ALT 20 09/13/2016   AST 21 09/13/2016   NA 138 09/13/2016   K 4.6 09/13/2016   CL 103 09/13/2016   CREATININE 0.71 09/13/2016   BUN 20 09/13/2016   CO2 28 09/13/2016   TSH 2.53 05/17/2016    Mm Screening Breast Tomo Bilateral  Result Date: 08/02/2016 CLINICAL DATA:  Screening. EXAM: 2D DIGITAL SCREENING BILATERAL MAMMOGRAM WITH CAD AND ADJUNCT TOMO COMPARISON:  Previous exam(s). ACR Breast Density Category a: The breast tissue is almost entirely fatty. FINDINGS: There are no findings suspicious for malignancy. Images were processed with CAD. IMPRESSION: No mammographic evidence of malignancy. A result letter of this screening mammogram will be mailed directly to the patient. RECOMMENDATION: Screening mammogram in one year. (Code:SM-B-01Y) BI-RADS CATEGORY  1: Negative. Electronically Signed   By: Curlene Dolphin M.D.   On: 08/02/2016 16:05         Assessment & Plan:   Problem List Items Addressed This Visit    Abnormal liver function tests    Follow liver panel.        Allergic rhinitis    Some dizziness as outlined.  Has a history of vertigo.  Not severe.  She feels related to allergy/sinus issues.  Will start taking her zyrtec.  Continue flonase.  Desires no further testing or intervention for the dizziness.  Will notify me of be reevaluated if persistent.        Bronchitis, mucopurulent recurrent (HCC)    Is recurrent problem.  Saw pulmonary.  See Dr Anastasia Pall note.  Given doxycycline and prednisone to have if flare.  Knows to  be evaluated if symptoms worsen or do not improve.        Family history of aortic aneurysm    Father had h/o aortic aneurysm.  She will check with insurance regarding coverage for aortic ultrasound.        Hypercholesterolemia    Has tried previous statin medications.  Had aching/intolerance.  Low cholesterol diet and exercise.  Follow lipid panel.  Off crestor.        Relevant Orders   Comprehensive metabolic panel   TSH   Lipid panel   Thrombocytopenia, unspecified (HCC)    Last platelet count wnl.  Follow.       Relevant Orders   CBC with Differential/Platelet    Other Visit Diagnoses    Screening for breast cancer    -  Primary   Relevant Orders   MM DIGITAL SCREENING BILATERAL   Need for immunization against influenza       Relevant Orders   Flu Vaccine QUAD 36+ mos IM (Completed)       Einar Pheasant, MD

## 2017-07-16 ENCOUNTER — Encounter: Payer: Self-pay | Admitting: Internal Medicine

## 2017-07-16 DIAGNOSIS — Z8249 Family history of ischemic heart disease and other diseases of the circulatory system: Secondary | ICD-10-CM | POA: Insufficient documentation

## 2017-07-16 NOTE — Assessment & Plan Note (Signed)
Follow liver panel.  

## 2017-07-16 NOTE — Assessment & Plan Note (Signed)
Is recurrent problem.  Saw pulmonary.  See Dr Anastasia Pall note.  Given doxycycline and prednisone to have if flare.  Knows to be evaluated if symptoms worsen or do not improve.

## 2017-07-16 NOTE — Assessment & Plan Note (Signed)
Some dizziness as outlined.  Has a history of vertigo.  Not severe.  She feels related to allergy/sinus issues.  Will start taking her zyrtec.  Continue flonase.  Desires no further testing or intervention for the dizziness.  Will notify me of be reevaluated if persistent.

## 2017-07-16 NOTE — Assessment & Plan Note (Signed)
Father had h/o aortic aneurysm.  She will check with insurance regarding coverage for aortic ultrasound.

## 2017-07-16 NOTE — Assessment & Plan Note (Signed)
Last platelet count wnl.  Follow.  

## 2017-07-16 NOTE — Assessment & Plan Note (Signed)
Has tried previous statin medications.  Had aching/intolerance.  Low cholesterol diet and exercise.  Follow lipid panel.  Off crestor.

## 2017-07-25 ENCOUNTER — Ambulatory Visit (INDEPENDENT_AMBULATORY_CARE_PROVIDER_SITE_OTHER): Payer: BC Managed Care – PPO | Admitting: Podiatry

## 2017-07-25 ENCOUNTER — Encounter: Payer: Self-pay | Admitting: Podiatry

## 2017-07-25 VITALS — BP 142/62 | HR 64 | Resp 16

## 2017-07-25 DIAGNOSIS — L603 Nail dystrophy: Secondary | ICD-10-CM

## 2017-07-25 NOTE — Progress Notes (Signed)
Subjective:  [name], female    DOB: Mar 17, 1956,  MRN: [id number] HPI Chief Complaint  Patient presents with  . Nail Problem    Hallux left - nail thick, discolored x few months, tender at times, states husband has same problem and he has appt next week.    61 y.o. female presents with the above complaint.     Past Medical History:  Diagnosis Date  . Arthritis   . Asthma   . Chronic sinusitis with recurrent bronchitis    Past Surgical History:  Procedure Laterality Date  . ABDOMINAL HYSTERECTOMY  2004   endometriosis  . TONSILLECTOMY  1060's    Current Outpatient Prescriptions:  .  omeprazole (PRILOSEC) 20 MG capsule, Take by mouth., Disp: , Rfl:  .  albuterol (PROVENTIL HFA;VENTOLIN HFA) 108 (90 Base) MCG/ACT inhaler, Inhale 2 puffs into the lungs every 6 (six) hours as needed for wheezing or shortness of breath., Disp: 1 Inhaler, Rfl: 2 .  B Complex-C (B-COMPLEX WITH VITAMIN C) tablet, Take 1 tablet by mouth daily., Disp: , Rfl:  .  Calcium Carbonate-Vitamin D (CALTRATE 600+D PO), Take by mouth., Disp: , Rfl:  .  cetirizine (ZYRTEC) 10 MG tablet, Take 10 mg by mouth daily., Disp: , Rfl:  .  fluticasone (FLONASE) 50 MCG/ACT nasal spray, USE 2 SPRAYS IN EACH NOSTRIL DAILY, Disp: 16 g, Rfl: 3 .  Multiple Vitamins-Minerals (CENTRUM SILVER PO), Take by mouth., Disp: , Rfl:  .  predniSONE (DELTASONE) 10 MG tablet, Take 6 tablets x 1 day and then decrease by 1/2 tablet per day until down to zero mg., Disp: 39 tablet, Rfl: 0 .  Red Yeast Rice Extract (RED YEAST RICE PO), Take by mouth., Disp: , Rfl:  .  rosuvastatin (CRESTOR) 5 MG tablet, Take one tablet q Monday, Wednesday and Friday. (Patient not taking: Reported on 07/13/2017), Disp: 30 tablet, Rfl: 1  Allergies  Allergen Reactions  . Penicillins     Hives, rash   Review of Systems  All other systems reviewed and are negative.  Objective:   Vitals:   07/25/17 1455  BP: (!) 142/62  Pulse: 64    Resp: 16    General: Well developed, nourished, in no acute distress, alert and oriented x3   Dermatological: Skin is warm, dry and supple bilateral. Nails x 10 are well maintained; remaining integument appears unremarkable at this time. There are no open sores, no preulcerative lesions, no rash or signs of infection present.Thick discoloration of the hallux nails with distal onycholysis of the hallux left. There appears to be some subungual debris discoloration and some malodor. This does appear to be some type of infection such as dermatophyte or yeast or fungus. There may be some nail dystrophy associated this as well.  Vascular: Dorsalis Pedis artery and Posterior Tibial artery pedal pulses are 2/4 bilateral with immedate capillary fill time. Pedal hair growth present. No varicosities and no lower extremity edema present bilateral.   Neruologic: Grossly intact via light touch bilateral. Vibratory intact via tuning fork bilateral. Protective threshold with Semmes Wienstein monofilament intact to all pedal sites bilateral. Patellar and Achilles deep tendon reflexes 2+ bilateral. No Babinski or clonus noted bilateral.   Musculoskeletal: No gross boney pedal deformities bilateral. No pain, crepitus, or limitation noted with foot and ankle range of motion bilateral. Muscular strength 5/5 in all groups tested bilateral.  Gait: Unassisted, Nonantalgic.    Radiographs:  None taken  Assessment & Plan:   Assessment: Nail  dystrophy cannot rule out onychomycosis.  Plan: Samples of the nail were taken today for pathologic evaluation. We will notify her as to those results.     Max T. Sidney, [place]

## 2017-07-28 ENCOUNTER — Other Ambulatory Visit (INDEPENDENT_AMBULATORY_CARE_PROVIDER_SITE_OTHER): Payer: BC Managed Care – PPO

## 2017-07-28 DIAGNOSIS — E7800 Pure hypercholesterolemia, unspecified: Secondary | ICD-10-CM | POA: Diagnosis not present

## 2017-07-28 DIAGNOSIS — D696 Thrombocytopenia, unspecified: Secondary | ICD-10-CM | POA: Diagnosis not present

## 2017-07-28 LAB — CBC WITH DIFFERENTIAL/PLATELET
Basophils Absolute: 0 10*3/uL (ref 0.0–0.1)
Basophils Relative: 0.9 % (ref 0.0–3.0)
EOS ABS: 0.1 10*3/uL (ref 0.0–0.7)
EOS PCT: 3 % (ref 0.0–5.0)
HEMATOCRIT: 42.9 % (ref 36.0–46.0)
HEMOGLOBIN: 14.1 g/dL (ref 12.0–15.0)
LYMPHS PCT: 35.7 % (ref 12.0–46.0)
Lymphs Abs: 1.2 10*3/uL (ref 0.7–4.0)
MCHC: 32.8 g/dL (ref 30.0–36.0)
MCV: 88.9 fl (ref 78.0–100.0)
MONO ABS: 0.3 10*3/uL (ref 0.1–1.0)
Monocytes Relative: 8.5 % (ref 3.0–12.0)
NEUTROS ABS: 1.7 10*3/uL (ref 1.4–7.7)
Neutrophils Relative %: 51.9 % (ref 43.0–77.0)
PLATELETS: 198 10*3/uL (ref 150.0–400.0)
RBC: 4.83 Mil/uL (ref 3.87–5.11)
RDW: 13.5 % (ref 11.5–15.5)
WBC: 3.3 10*3/uL — ABNORMAL LOW (ref 4.0–10.5)

## 2017-07-28 LAB — LIPID PANEL
CHOLESTEROL: 249 mg/dL — AB (ref 0–200)
HDL: 69.1 mg/dL (ref >39.00)
LDL CALC: 163 mg/dL — AB (ref 0–99)
NonHDL: 180.09
TRIGLYCERIDES: 84 mg/dL (ref 0.0–149.0)
Total CHOL/HDL Ratio: 4
VLDL: 16.8 mg/dL (ref 0.0–40.0)

## 2017-07-28 LAB — COMPREHENSIVE METABOLIC PANEL
ALBUMIN: 4.5 g/dL (ref 3.5–5.2)
ALT: 24 U/L (ref 0–35)
AST: 19 U/L (ref 0–37)
Alkaline Phosphatase: 92 U/L (ref 39–117)
BUN: 15 mg/dL (ref 6–23)
CALCIUM: 9.6 mg/dL (ref 8.4–10.5)
CHLORIDE: 102 meq/L (ref 96–112)
CO2: 32 meq/L (ref 19–32)
CREATININE: 0.81 mg/dL (ref 0.40–1.20)
GFR: 75.8 mL/min (ref >60.00)
Glucose, Bld: 93 mg/dL (ref 70–99)
Potassium: 4.5 mEq/L (ref 3.5–5.1)
Sodium: 139 mEq/L (ref 135–145)
Total Bilirubin: 0.4 mg/dL (ref 0.2–1.2)
Total Protein: 7.3 g/dL (ref 6.0–8.3)

## 2017-07-28 LAB — TSH: TSH: 1.79 u[IU]/mL (ref 0.35–4.50)

## 2017-08-01 DIAGNOSIS — C4491 Basal cell carcinoma of skin, unspecified: Secondary | ICD-10-CM

## 2017-08-01 HISTORY — DX: Basal cell carcinoma of skin, unspecified: C44.91

## 2017-08-01 MED ORDER — PRAVASTATIN SODIUM 10 MG PO TABS: 10.0000 mg | ORAL_TABLET | Freq: Every day | ORAL | 2 refills | Status: DC

## 2017-08-01 NOTE — Addendum Note (Signed)
Addended by: Francella Solian on: 08/01/2017 10:22 AM   Modules accepted: Orders

## 2017-08-16 ENCOUNTER — Ambulatory Visit
Admission: RE | Admit: 2017-08-16 | Discharge: 2017-08-16 | Disposition: A | Discharge status: Home / Self Care | Payer: BC Managed Care – PPO | Source: Ambulatory Visit | Attending: Internal Medicine | Admitting: Internal Medicine

## 2017-08-16 DIAGNOSIS — Z1231 Encounter for screening mammogram for malignant neoplasm of breast: Secondary | ICD-10-CM | POA: Diagnosis present

## 2017-08-16 DIAGNOSIS — Z1239 Encounter for other screening for malignant neoplasm of breast: Secondary | ICD-10-CM

## 2017-08-22 ENCOUNTER — Ambulatory Visit (INDEPENDENT_AMBULATORY_CARE_PROVIDER_SITE_OTHER): Payer: BC Managed Care – PPO | Admitting: Podiatry

## 2017-08-22 ENCOUNTER — Encounter: Payer: Self-pay | Admitting: Podiatry

## 2017-08-22 DIAGNOSIS — Z79899 Other long term (current) drug therapy: Secondary | ICD-10-CM

## 2017-08-22 DIAGNOSIS — L603 Nail dystrophy: Secondary | ICD-10-CM

## 2017-08-22 MED ORDER — TERBINAFINE HCL 250 MG PO TABS: 250.0000 mg | ORAL_TABLET | Freq: Every day | ORAL | 0 refills | Status: DC

## 2017-08-22 NOTE — Progress Notes (Signed)
Presents today for follow-up of her pathology report.  Objective: No change in nail status. A saprophytic fungus was noted on pathology.  Assessment: Onychomycosis.  Plan: Discussed etiology pathology concerned versus surgical therapies. Started her on this today and will follow up with her in a few weeks for a liver profile. She will start terbinafine 250 mg tablets 1 by mouth daily. We discussed the possible side effects. Follow up with her 1 month.

## 2017-08-22 NOTE — Patient Instructions (Signed)

## 2017-09-07 ENCOUNTER — Other Ambulatory Visit: Payer: Self-pay | Admitting: Internal Medicine

## 2017-09-07 ENCOUNTER — Telehealth: Payer: Self-pay | Admitting: Radiology

## 2017-09-07 DIAGNOSIS — D72819 Decreased white blood cell count, unspecified: Secondary | ICD-10-CM

## 2017-09-07 DIAGNOSIS — E7800 Pure hypercholesterolemia, unspecified: Secondary | ICD-10-CM

## 2017-09-07 NOTE — Telephone Encounter (Signed)
Orders placed for labs

## 2017-09-07 NOTE — Telephone Encounter (Signed)
Pt coming in next Monday for labs, please place future orders. Thank you.

## 2017-09-07 NOTE — Progress Notes (Signed)
Orders placed for labs

## 2017-09-12 ENCOUNTER — Other Ambulatory Visit: Payer: BC Managed Care – PPO

## 2017-09-14 ENCOUNTER — Other Ambulatory Visit (INDEPENDENT_AMBULATORY_CARE_PROVIDER_SITE_OTHER): Payer: BC Managed Care – PPO

## 2017-09-14 DIAGNOSIS — E7800 Pure hypercholesterolemia, unspecified: Secondary | ICD-10-CM | POA: Diagnosis not present

## 2017-09-14 DIAGNOSIS — D72819 Decreased white blood cell count, unspecified: Secondary | ICD-10-CM | POA: Diagnosis not present

## 2017-09-14 LAB — CBC WITH DIFFERENTIAL/PLATELET
Basophils Absolute: 0 10*3/uL (ref 0.0–0.1)
Basophils Relative: 0.8 % (ref 0.0–3.0)
EOS PCT: 3.3 % (ref 0.0–5.0)
Eosinophils Absolute: 0.1 10*3/uL (ref 0.0–0.7)
HEMATOCRIT: 41.1 % (ref 36.0–46.0)
HEMOGLOBIN: 13.6 g/dL (ref 12.0–15.0)
LYMPHS PCT: 35.9 % (ref 12.0–46.0)
Lymphs Abs: 1.2 10*3/uL (ref 0.7–4.0)
MCHC: 33 g/dL (ref 30.0–36.0)
MCV: 88.6 fl (ref 78.0–100.0)
MONOS PCT: 8.6 % (ref 3.0–12.0)
Monocytes Absolute: 0.3 10*3/uL (ref 0.1–1.0)
Neutro Abs: 1.7 10*3/uL (ref 1.4–7.7)
Neutrophils Relative %: 51.4 % (ref 43.0–77.0)
Platelets: 192 10*3/uL (ref 150.0–400.0)
RBC: 4.64 Mil/uL (ref 3.87–5.11)
RDW: 13.6 % (ref 11.5–15.5)
WBC: 3.4 10*3/uL — AB (ref 4.0–10.5)

## 2017-09-14 LAB — HEPATIC FUNCTION PANEL
ALT: 31 U/L (ref 0–35)
AST: 28 U/L (ref 0–37)
Albumin: 4.4 g/dL (ref 3.5–5.2)
Alkaline Phosphatase: 84 U/L (ref 39–117)
Bilirubin, Direct: 0.1 mg/dL (ref 0.0–0.3)
TOTAL PROTEIN: 7.2 g/dL (ref 6.0–8.3)
Total Bilirubin: 0.4 mg/dL (ref 0.2–1.2)

## 2017-09-15 ENCOUNTER — Other Ambulatory Visit: Payer: Self-pay | Admitting: Internal Medicine

## 2017-09-15 DIAGNOSIS — E7800 Pure hypercholesterolemia, unspecified: Secondary | ICD-10-CM

## 2017-09-15 DIAGNOSIS — D72819 Decreased white blood cell count, unspecified: Secondary | ICD-10-CM

## 2017-09-15 NOTE — Progress Notes (Signed)
Orders placed for f/u labs.  

## 2017-09-17 ENCOUNTER — Other Ambulatory Visit: Payer: Self-pay | Admitting: Podiatry

## 2017-09-19 ENCOUNTER — Ambulatory Visit (INDEPENDENT_AMBULATORY_CARE_PROVIDER_SITE_OTHER): Payer: BC Managed Care – PPO | Admitting: Podiatry

## 2017-09-19 ENCOUNTER — Encounter: Payer: Self-pay | Admitting: Podiatry

## 2017-09-19 DIAGNOSIS — L603 Nail dystrophy: Secondary | ICD-10-CM

## 2017-09-19 DIAGNOSIS — Z79899 Other long term (current) drug therapy: Secondary | ICD-10-CM | POA: Diagnosis not present

## 2017-09-19 MED ORDER — TERBINAFINE HCL 250 MG PO TABS: 250.0000 mg | ORAL_TABLET | Freq: Every day | ORAL | 0 refills | Status: DC

## 2017-09-19 NOTE — Progress Notes (Signed)
She presents today for follow-up of her Lamisil therapy.  She completed her 30 days.  She denies fever chills nausea vomiting muscle aches pains rashes or itching.  She states that she can already tell that her toenails are starting to grow out.  Objective: Vital signs are stable alert and oriented x3.  Pulses are palpable.  There is no erythema edema cellulitis drainage or odor no change in nail plates that I can tell yet.  Assessment: Long-term therapy with Lamisil for onychomycosis.  Plan: Blood work was just performed yesterday and it appears to be normal and at this point I feel it is okay to go ahead and start her on her 90-day dose of medicine.  I will follow-up with her as needed or in 4 months.

## 2017-10-27 ENCOUNTER — Other Ambulatory Visit: Payer: Self-pay | Admitting: Internal Medicine

## 2017-11-14 ENCOUNTER — Other Ambulatory Visit (INDEPENDENT_AMBULATORY_CARE_PROVIDER_SITE_OTHER): Payer: BC Managed Care – PPO

## 2017-11-14 DIAGNOSIS — E7800 Pure hypercholesterolemia, unspecified: Secondary | ICD-10-CM | POA: Diagnosis not present

## 2017-11-14 DIAGNOSIS — D72819 Decreased white blood cell count, unspecified: Secondary | ICD-10-CM | POA: Diagnosis not present

## 2017-11-14 LAB — BASIC METABOLIC PANEL
BUN: 16 mg/dL (ref 6–23)
CALCIUM: 9.5 mg/dL (ref 8.4–10.5)
CO2: 31 mEq/L (ref 19–32)
CREATININE: 0.78 mg/dL (ref 0.40–1.20)
Chloride: 103 mEq/L (ref 96–112)
GFR: 79.1 mL/min (ref >60.00)
Glucose, Bld: 95 mg/dL (ref 70–99)
Potassium: 4.2 mEq/L (ref 3.5–5.1)
Sodium: 141 mEq/L (ref 135–145)

## 2017-11-14 LAB — CBC WITH DIFFERENTIAL/PLATELET
BASOS PCT: 0.9 %
Basophils Absolute: 32 cells/uL (ref 0–200)
EOS ABS: 112 {cells}/uL (ref 15–500)
Eosinophils Relative: 3.2 %
HCT: 41.9 % (ref 35.0–45.0)
Hemoglobin: 14.2 g/dL (ref 11.7–15.5)
Lymphs Abs: 1355 cells/uL (ref 850–3900)
MCH: 28.8 pg (ref 27.0–33.0)
MCHC: 33.9 g/dL (ref 32.0–36.0)
MCV: 85 fL (ref 80.0–100.0)
MPV: 9.9 fL (ref 7.5–12.5)
Monocytes Relative: 8.7 %
NEUTROS PCT: 48.5 %
Neutro Abs: 1698 cells/uL (ref 1500–7800)
PLATELETS: 213 10*3/uL (ref 140–400)
RBC: 4.93 10*6/uL (ref 3.80–5.10)
RDW: 12.9 % (ref 11.0–15.0)
TOTAL LYMPHOCYTE: 38.7 %
WBC: 3.5 10*3/uL — ABNORMAL LOW (ref 3.8–10.8)
WBCMIX: 305 {cells}/uL (ref 200–950)

## 2017-11-14 LAB — HEPATIC FUNCTION PANEL
ALK PHOS: 80 U/L (ref 39–117)
ALT: 27 U/L (ref 0–35)
AST: 25 U/L (ref 0–37)
Albumin: 4.5 g/dL (ref 3.5–5.2)
BILIRUBIN DIRECT: 0 mg/dL (ref 0.0–0.3)
BILIRUBIN TOTAL: 0.4 mg/dL (ref 0.2–1.2)
TOTAL PROTEIN: 7.3 g/dL (ref 6.0–8.3)

## 2017-11-14 LAB — LIPID PANEL
Cholesterol: 240 mg/dL — ABNORMAL HIGH (ref 0–200)
HDL: 59.4 mg/dL (ref >39.00)
LDL Cholesterol: 159 mg/dL — ABNORMAL HIGH (ref 0–99)
NonHDL: 180.87
TRIGLYCERIDES: 111 mg/dL (ref 0.0–149.0)
Total CHOL/HDL Ratio: 4
VLDL: 22.2 mg/dL (ref 0.0–40.0)

## 2017-11-14 NOTE — Addendum Note (Signed)
Addended by: Arby Barrette on: 11/14/2017 08:01 AM   Modules accepted: Orders

## 2017-11-15 ENCOUNTER — Ambulatory Visit: Payer: BC Managed Care – PPO | Admitting: Internal Medicine

## 2018-01-18 ENCOUNTER — Encounter: Payer: Self-pay | Admitting: Podiatry

## 2018-01-18 ENCOUNTER — Ambulatory Visit (INDEPENDENT_AMBULATORY_CARE_PROVIDER_SITE_OTHER): Payer: BC Managed Care – PPO | Admitting: Podiatry

## 2018-01-18 DIAGNOSIS — L603 Nail dystrophy: Secondary | ICD-10-CM | POA: Diagnosis not present

## 2018-01-18 MED ORDER — TERBINAFINE HCL 250 MG PO TABS: 250.0000 mg | ORAL_TABLET | Freq: Every day | ORAL | 0 refills | Status: DC

## 2018-01-18 NOTE — Progress Notes (Signed)
She presents today for follow-up of her onychomycosis.  She is completed 120 days with medication and states that it appears that they have grown out by about half.  She denies any problems taking medication.  Objective: Vital signs are stable alert and oriented x3.  Pulses are palpable.  Nail plates appear to be clearing by half particularly the hallux and second digit of the left foot.  Assessment: Long-term therapy of Lamisil for onychomycosis.  Plan: At this point I am going to start her on every other day dose of Lamisil to help clear the remainder part of the nails.  I will follow-up with her in 3 weeks.  Dispensed 30 tablets 1 every other day is 2 months.

## 2018-01-18 NOTE — Patient Instructions (Signed)
Dr. Hyatt has sent over a refill for Lamisil to your pharmacy today. The instructions on your bottle will say "take 1 tablet daily", however, he would like for you to take one pill every other day. He will follow up with you in 3 months to re-evaluate your toenails. 

## 2018-01-25 ENCOUNTER — Ambulatory Visit: Payer: BC Managed Care – PPO | Admitting: Internal Medicine

## 2018-01-31 ENCOUNTER — Ambulatory Visit: Payer: BC Managed Care – PPO | Admitting: Internal Medicine

## 2018-02-20 ENCOUNTER — Encounter: Payer: Self-pay | Admitting: Internal Medicine

## 2018-02-20 ENCOUNTER — Ambulatory Visit (INDEPENDENT_AMBULATORY_CARE_PROVIDER_SITE_OTHER): Payer: BC Managed Care – PPO

## 2018-02-20 ENCOUNTER — Ambulatory Visit (INDEPENDENT_AMBULATORY_CARE_PROVIDER_SITE_OTHER): Payer: BC Managed Care – PPO | Admitting: Internal Medicine

## 2018-02-20 VITALS — BP 130/76 | HR 67 | Temp 98.4°F | Resp 15 | Ht 64.25 in | Wt 165.8 lb

## 2018-02-20 DIAGNOSIS — R05 Cough: Secondary | ICD-10-CM | POA: Diagnosis not present

## 2018-02-20 DIAGNOSIS — R058 Other specified cough: Secondary | ICD-10-CM

## 2018-02-20 DIAGNOSIS — J209 Acute bronchitis, unspecified: Secondary | ICD-10-CM

## 2018-02-20 MED ORDER — METHYLPREDNISOLONE ACETATE 80 MG/ML IJ SUSP
80.0000 mg | Freq: Once | INTRAMUSCULAR | Status: AC
  Administered 2018-02-20: 80 mg via INTRAMUSCULAR

## 2018-02-20 MED ORDER — CLARITHROMYCIN 250 MG PO TABS: 250.0000 mg | ORAL_TABLET | Freq: Two times a day (BID) | ORAL | 0 refills | Status: DC

## 2018-02-20 MED ORDER — HYDROCOD POLST-CPM POLST ER 10-8 MG/5ML PO SUER: 5.0000 mL | Freq: Every evening | ORAL | 0 refills | Status: DC | PRN

## 2018-02-20 MED ORDER — PREDNISONE 10 MG PO TABS: ORAL_TABLET | ORAL | 0 refills | Status: DC

## 2018-02-20 MED ORDER — BENZONATATE 200 MG PO CAPS: 200.0000 mg | ORAL_CAPSULE | Freq: Three times a day (TID) | ORAL | 1 refills | Status: DC | PRN

## 2018-02-20 NOTE — Patient Instructions (Addendum)
You are having an ASTHMA exacerbation triggered by a sinus infection    I am prescribing clarithromycin twice  daily for 7 days for the infection.    I am prescribing you a 3 days of 60 mg prednisone  Daily, followed by a 5 day prednisone taper  For the cough:   tessalon perles for the daytime  cough  You can fill the Tussionex  cough syrup to use at night if something stronger is needed . It contains hydrocodone (the active ingredient in Vicodin)  So BE VERY CAREFUL WITH IT!    Please take a probiotic ( Align, Floraque or Culturelle),  the generic version of one of these, or eat a serving of good quality yogurt  With live cultures   For a minimum of 3 weeks to prevent a serious antibiotic associated diarrhea  Called clostridium dificile colitis

## 2018-02-20 NOTE — Progress Notes (Signed)
Subjective:  [name], female    DOB: [birth year]  Age: 62 y.o. MRN: [id number]  CC: The primary encounter diagnosis was Cough with sputum. A diagnosis of Acute bronchitis, unspecified organism was also pertinent to this visit.  HPI 2 week history of cough productive of purulent blood streaked sputum accompanied by subjective fevers (spiked to 101,  Last night t max was 100     ) accompanied by  Chest tightness and dyspnea. . Started taking doxycycline and  5 MG DAILY prednisone taper starting at 60 mg. when symptoms first began  2 weeks ago with MDI .  Did not improve so after  2 days went to Urgent Care and was prescribed 750 mg  Levaquin  X 7 days which she finished, plus continued  Prednisone taper currently at 20 mg daily. Has not improved.  Some sinus congestion and lots of drainage.  Lost her sense of taste for the past 2 days   Has a  history of recurrent bronchitis with asthma  (not confirmed By PFTs?) and prior CAP   Outpatient Medications Prior to Visit  Medication Sig Dispense Refill  . albuterol (PROVENTIL HFA;VENTOLIN HFA) 108 (90 Base) MCG/ACT inhaler Inhale 2 puffs into the lungs every 6 (six) hours as needed for wheezing or shortness of breath. 1 Inhaler 2  . Calcium Carbonate-Vitamin D (CALTRATE 600+D PO) Take by mouth.    . cetirizine (ZYRTEC) 10 MG tablet Take 10 mg by mouth daily.    . fluticasone (FLONASE) 50 MCG/ACT nasal spray USE 2 SPRAYS IN EACH NOSTRIL DAILY 16 g 3  . Multiple Vitamins-Minerals (CENTRUM SILVER PO) Take by mouth.    . pravastatin (PRAVACHOL) 10 MG tablet TAKE 1 TABLET BY MOUTH EVERY DAY 30 tablet 2  . predniSONE (DELTASONE) 10 MG tablet Take 6 tablets x 1 day and then decrease by 1/2 tablet per day until down to zero mg. 39 tablet 0  . Red Yeast Rice Extract (RED YEAST RICE PO) Take by mouth.    . terbinafine (LAMISIL) 250 MG tablet Take 1 tablet (250 mg total) by mouth daily. 30 tablet 0  . B Complex-C (B-COMPLEX WITH VITAMIN C)  tablet Take 1 tablet by mouth daily.    Marland Kitchen omeprazole (PRILOSEC) 20 MG capsule Take by mouth.    . rosuvastatin (CRESTOR) 5 MG tablet Take one tablet q Monday, Wednesday and Friday. (Patient not taking: Reported on 02/20/2018) 30 tablet 1   No facility-administered medications prior to visit.     Review of Systems;  Patient denies headache, fevers, malaise, unintentional weight loss, skin rash, eye pain, sinus congestion and sinus pain, sore throat, dysphagia,  hemoptysis , cough, dyspnea, wheezing, chest pain, palpitations, orthopnea, edema, abdominal pain, nausea, melena, diarrhea, constipation, flank pain, dysuria, hematuria, urinary  Frequency, nocturia, numbness, tingling, seizures,  Focal weakness, Loss of consciousness,  Tremor, insomnia, depression, anxiety, and suicidal ideation.      Objective:  BP 130/76 (BP Location: Left Arm, Patient Position: Sitting, Cuff Size: Normal)   Pulse 67   Temp 98.4 F (36.9 C) (Oral)   Resp 15   Ht 5' 4.25" (1.632 m)   Wt 165 lb 12.8 oz (75.2 kg)   SpO2 95%   BMI 28.24 kg/m   BP Readings from Last 3 Encounters:  02/20/18 130/76  07/25/17 (!) 142/62  07/13/17 130/78    Wt Readings from Last 3 Encounters:  02/20/18 165 lb 12.8 oz (75.2 kg)  07/13/17 164 lb 12.8  oz (74.8 kg)  05/11/16 167 lb (75.8 kg)    General appearance:  Well appearing,  alert, cooperative and appears stated age Ears: TMs are intact but erythematous , no effusions Throat: lips, mucosa, and tongue normal; teeth and gums normal Neck: mild tender cervical adenopathy, no carotid bruit, supple, symmetrical, trachea midline and thyroid not enlarged, symmetric, no tenderness/mass/nodules Back: symmetric, no curvature. ROM normal. No CVA tenderness. Lungs: bilateral ronchi and wheezing without egophony  Heart: regular rate and rhythm, S1, S2 normal, no murmur, click, rub or gallop Abdomen: soft, non-tender; bowel sounds normal; no masses,  no organomegaly Pulses: 2+ and  symmetric Skin: Skin color, texture, turgor normal. No rashes or lesions Lymph nodes: Cervical, supraclavicular, and axillary nodes normal.  No results found for: HGBA1C  Lab Results  Component Value Date   CREATININE 0.78 11/14/2017   CREATININE 0.81 07/28/2017   CREATININE 0.71 09/13/2016    Lab Results  Component Value Date   WBC 3.5 (L) 11/14/2017   HGB 14.2 11/14/2017   HCT 41.9 11/14/2017   PLT 213 11/14/2017   GLUCOSE 95 11/14/2017   CHOL 240 (H) 11/14/2017   TRIG 111.0 11/14/2017   HDL 59.40 11/14/2017   LDLCALC 159 (H) 11/14/2017   ALT 27 11/14/2017   AST 25 11/14/2017   NA 141 11/14/2017   K 4.2 11/14/2017   CL 103 11/14/2017   CREATININE 0.78 11/14/2017   BUN 16 11/14/2017   CO2 31 11/14/2017   TSH 1.79 07/28/2017    Mm Screening Breast Tomo Bilateral  Result Date: 08/16/2017 CLINICAL DATA:  Screening. EXAM: 2D DIGITAL SCREENING BILATERAL MAMMOGRAM WITH CAD AND ADJUNCT TOMO COMPARISON:  Previous exam(s). ACR Breast Density Category b: There are scattered areas of fibroglandular density. FINDINGS: There are no findings suspicious for malignancy. Images were processed with CAD. IMPRESSION: No mammographic evidence of malignancy. A result letter of this screening mammogram will be mailed directly to the patient. RECOMMENDATION: Screening mammogram in one year. (Code:SM-B-01Y) BI-RADS CATEGORY  1: Negative. Electronically Signed   By: Dorise Bullion III M.D   On: 08/16/2017 09:33    Assessment & Plan:   Problem List Items Addressed This Visit    Acute bronchitis    Sinusitis and otitis now appear to be consequences of continued congestion.  Clarithromycn,  Restart prednisone 60 mg daily x 3,  Then 10 mg  taper,   Cough supression with tesalon and tussionex.  Chest x ray noting Minimal bronchitic changes with increased bibasilar atelectasis versus scarring  . Albuterol neb and dexamethasone 80 mg given in office with immediate improvement in chest tightness. RTC  one week        Other Visit Diagnoses    Cough with sputum    -  Primary   Relevant Medications   methylPREDNISolone acetate (DEPO-MEDROL) injection 80 mg (Completed)   Other Relevant Orders   DG Chest 2 View (Completed)      I have discontinued Berdie Ogren. Markus's B-complex with vitamin C, rosuvastatin, and omeprazole. I am also having her start on clarithromycin, predniSONE, benzonatate, and chlorpheniramine-HYDROcodone. Additionally, I am having her maintain her cetirizine, Red Yeast Rice Extract (RED YEAST RICE PO), Calcium Carbonate-Vitamin D (CALTRATE 600+D PO), Multiple Vitamins-Minerals (CENTRUM SILVER PO), fluticasone, albuterol, predniSONE, pravastatin, and terbinafine. We administered methylPREDNISolone acetate.  Meds ordered this encounter  Medications  . clarithromycin (BIAXIN) 250 MG tablet    Sig: Take 1 tablet (250 mg total) by mouth 2 (two) times daily.    Dispense:  14 tablet    Refill:  0  . predniSONE (DELTASONE) 10 MG tablet    Sig: 6 tablets on Days 1 and 2 and 3 , , then reduce by 1 tablet  daily until gone    Dispense:  33 tablet    Refill:  0  . benzonatate (TESSALON) 200 MG capsule    Sig: Take 1 capsule (200 mg total) by mouth 3 (three) times daily as needed for cough.    Dispense:  60 capsule    Refill:  1  . chlorpheniramine-HYDROcodone (TUSSIONEX PENNKINETIC ER) 10-8 MG/5ML SUER    Sig: Take 5 mLs by mouth at bedtime as needed for cough.    Dispense:  140 mL    Refill:  0  . methylPREDNISolone acetate (DEPO-MEDROL) injection 80 mg    Medications Discontinued During This Encounter  Medication Reason  . B Complex-C (B-COMPLEX WITH VITAMIN C) tablet Patient has not taken in last 30 days  . omeprazole (PRILOSEC) 20 MG capsule Patient has not taken in last 30 days  . rosuvastatin (CRESTOR) 5 MG tablet Patient has not taken in last 30 days    Follow-up: Return in about 1 week (around 02/27/2018).   Crecencio Mc, MD

## 2018-02-21 MED ORDER — ALBUTEROL SULFATE (2.5 MG/3ML) 0.083% IN NEBU: 2.5000 mg | INHALATION_SOLUTION | Freq: Once | RESPIRATORY_TRACT | Status: DC

## 2018-02-21 NOTE — Assessment & Plan Note (Addendum)
Sinusitis and otitis now appear to be consequences of continued congestion.  Clarithromycn,  Restart prednisone 60 mg daily x 3,  Then 10 mg  taper,   Cough supression with tesalon and tussionex.  Chest x ray noting Minimal bronchitic changes with increased bibasilar atelectasis versus scarring  . Albuterol neb and dexamethasone 80 mg given in office with immediate improvement in chest tightness. RTC one week

## 2018-03-01 ENCOUNTER — Ambulatory Visit: Payer: BC Managed Care – PPO | Admitting: Internal Medicine

## 2018-03-07 ENCOUNTER — Ambulatory Visit: Payer: BC Managed Care – PPO | Admitting: Internal Medicine

## 2018-03-07 DIAGNOSIS — Z0289 Encounter for other administrative examinations: Secondary | ICD-10-CM

## 2018-04-05 ENCOUNTER — Other Ambulatory Visit: Payer: Self-pay | Admitting: Internal Medicine

## 2018-04-19 ENCOUNTER — Ambulatory Visit (INDEPENDENT_AMBULATORY_CARE_PROVIDER_SITE_OTHER): Payer: BC Managed Care – PPO | Admitting: Podiatry

## 2018-04-19 ENCOUNTER — Encounter: Payer: Self-pay | Admitting: Podiatry

## 2018-04-19 DIAGNOSIS — L603 Nail dystrophy: Secondary | ICD-10-CM

## 2018-04-19 MED ORDER — CIPROFLOXACIN-DEXAMETHASONE 0.3-0.1 % OT SUSP: OTIC | 2 refills | Status: DC

## 2018-04-19 NOTE — Progress Notes (Signed)
She presents today for follow-up of her onychomycosis she says everything is doing very well however this 1 area is not sticking back down discoloration is better but there is still an open area beneath the nail plate.  She refers to the hallux left.  Objective: Vital signs are stable alert oriented x3 no erythema edema cellulitis drainage or odor nail plate hallux left does demonstrate approximately 1/4-1/8 of the nail that is loose from the nail bed.  This is in the distal medial border.  There is no blood no purulence no malodor discoloration appears to be resolving due to the use of the Lamisil.  All the other toes have nearly completely grown out.  Assessment: Well-healing onychomycosis with exception is one area.  Plan: I reevaluated her old pathology report which did demonstrate there is some bacteria that could possibly be present as well of the Pseudomonas variety.  So at this point I feel that its necessary to discontinue the use of the Lamisil and start her on a topical ciprofloxacin solution.  She will try this and I will follow-up with her in 4 months she will notify me with any regression to the other toenails.

## 2018-05-09 ENCOUNTER — Ambulatory Visit: Payer: BC Managed Care – PPO | Admitting: Internal Medicine

## 2018-07-15 ENCOUNTER — Other Ambulatory Visit: Payer: Self-pay | Admitting: Internal Medicine

## 2018-07-30 ENCOUNTER — Other Ambulatory Visit: Payer: Self-pay | Admitting: Internal Medicine

## 2018-08-14 ENCOUNTER — Encounter

## 2018-08-14 ENCOUNTER — Encounter: Payer: Self-pay | Admitting: Internal Medicine

## 2018-08-14 ENCOUNTER — Ambulatory Visit (INDEPENDENT_AMBULATORY_CARE_PROVIDER_SITE_OTHER): Payer: BC Managed Care – PPO | Admitting: Internal Medicine

## 2018-08-14 ENCOUNTER — Other Ambulatory Visit (HOSPITAL_COMMUNITY)
Admission: RE | Admit: 2018-08-14 | Discharge: 2018-08-14 | Disposition: A | Discharge status: Home / Self Care | Payer: BC Managed Care – PPO | Source: Ambulatory Visit | Attending: Internal Medicine | Admitting: Internal Medicine

## 2018-08-14 VITALS — BP 124/78 | HR 62 | Temp 98.4°F | Resp 18 | Wt 166.2 lb

## 2018-08-14 DIAGNOSIS — D696 Thrombocytopenia, unspecified: Secondary | ICD-10-CM

## 2018-08-14 DIAGNOSIS — Z0000 Encounter for general adult medical examination without abnormal findings: Secondary | ICD-10-CM | POA: Diagnosis not present

## 2018-08-14 DIAGNOSIS — J411 Mucopurulent chronic bronchitis: Secondary | ICD-10-CM

## 2018-08-14 DIAGNOSIS — Z124 Encounter for screening for malignant neoplasm of cervix: Secondary | ICD-10-CM

## 2018-08-14 DIAGNOSIS — E7800 Pure hypercholesterolemia, unspecified: Secondary | ICD-10-CM | POA: Diagnosis not present

## 2018-08-14 DIAGNOSIS — R7989 Other specified abnormal findings of blood chemistry: Secondary | ICD-10-CM

## 2018-08-14 DIAGNOSIS — Z1231 Encounter for screening mammogram for malignant neoplasm of breast: Secondary | ICD-10-CM | POA: Diagnosis not present

## 2018-08-14 DIAGNOSIS — R945 Abnormal results of liver function studies: Secondary | ICD-10-CM

## 2018-08-14 DIAGNOSIS — Z8249 Family history of ischemic heart disease and other diseases of the circulatory system: Secondary | ICD-10-CM

## 2018-08-14 LAB — BASIC METABOLIC PANEL
BUN: 17 mg/dL (ref 6–23)
CALCIUM: 9.9 mg/dL (ref 8.4–10.5)
CO2: 30 mEq/L (ref 19–32)
CREATININE: 0.78 mg/dL (ref 0.40–1.20)
Chloride: 101 mEq/L (ref 96–112)
GFR: 78.92 mL/min (ref >60.00)
Glucose, Bld: 96 mg/dL (ref 70–99)
POTASSIUM: 4.5 meq/L (ref 3.5–5.1)
Sodium: 137 mEq/L (ref 135–145)

## 2018-08-14 LAB — CBC WITH DIFFERENTIAL/PLATELET
BASOS PCT: 1 % (ref 0.0–3.0)
Basophils Absolute: 0 10*3/uL (ref 0.0–0.1)
Eosinophils Absolute: 0.1 10*3/uL (ref 0.0–0.7)
Eosinophils Relative: 2.7 % (ref 0.0–5.0)
HEMATOCRIT: 41.3 % (ref 36.0–46.0)
Hemoglobin: 13.8 g/dL (ref 12.0–15.0)
LYMPHS ABS: 1.5 10*3/uL (ref 0.7–4.0)
LYMPHS PCT: 40.4 % (ref 12.0–46.0)
MCHC: 33.5 g/dL (ref 30.0–36.0)
MCV: 87.1 fl (ref 78.0–100.0)
MONOS PCT: 8.2 % (ref 3.0–12.0)
Monocytes Absolute: 0.3 10*3/uL (ref 0.1–1.0)
NEUTROS ABS: 1.7 10*3/uL (ref 1.4–7.7)
NEUTROS PCT: 47.7 % (ref 43.0–77.0)
Platelets: 186 10*3/uL (ref 150.0–400.0)
RBC: 4.74 Mil/uL (ref 3.87–5.11)
RDW: 13.6 % (ref 11.5–15.5)
WBC: 3.6 10*3/uL — ABNORMAL LOW (ref 4.0–10.5)

## 2018-08-14 LAB — HEPATIC FUNCTION PANEL
ALBUMIN: 4.6 g/dL (ref 3.5–5.2)
ALT: 17 U/L (ref 0–35)
AST: 19 U/L (ref 0–37)
Alkaline Phosphatase: 75 U/L (ref 39–117)
Bilirubin, Direct: 0.1 mg/dL (ref 0.0–0.3)
TOTAL PROTEIN: 7.3 g/dL (ref 6.0–8.3)
Total Bilirubin: 0.5 mg/dL (ref 0.2–1.2)

## 2018-08-14 LAB — LIPID PANEL
CHOLESTEROL: 235 mg/dL — AB (ref 0–200)
HDL: 74.7 mg/dL (ref >39.00)
LDL Cholesterol: 143 mg/dL — ABNORMAL HIGH (ref 0–99)
NonHDL: 160.6
TRIGLYCERIDES: 87 mg/dL (ref 0.0–149.0)
Total CHOL/HDL Ratio: 3
VLDL: 17.4 mg/dL (ref 0.0–40.0)

## 2018-08-14 LAB — TSH: TSH: 1.36 u[IU]/mL (ref 0.35–4.50)

## 2018-08-14 MED ORDER — PREDNISONE 10 MG PO TABS: ORAL_TABLET | ORAL | 0 refills | Status: DC

## 2018-08-14 MED ORDER — DOXYCYCLINE HYCLATE 100 MG PO TABS: 100.0000 mg | ORAL_TABLET | Freq: Two times a day (BID) | ORAL | 0 refills | Status: DC

## 2018-08-14 NOTE — Progress Notes (Signed)
[name], female   DOB: 05-30-1956, 62 y.o.   MRN: [id number]   Subjective:    [name], female    DOB: 1956-08-02, 62 y.o.   MRN: [id number]  HPI  Patient here for a scheduled follow up.  Was overdue physical.  Physical performed today.  She reports she is doing well.  Tries to stay active.  No chest pain.  No sob.  Breathing doing well.  No cough or congestion.  Was evaluated previously by pulmonary.  Recommended rx for abx and prednisone to have if gets sick.  No acid reflux.  No abdominal pain.  Bowels moving.  No urine change.  Overall feels good.     Past Medical History:  Diagnosis Date  . Arthritis   . Asthma   . Chronic sinusitis with recurrent bronchitis    Past Surgical History:  Procedure Laterality Date  . ABDOMINAL HYSTERECTOMY  2004   endometriosis  . TONSILLECTOMY  1060's   Family History  Problem Relation Age of Onset  . COPD Mother   . Cancer Mother        breast and colon  . Breast cancer Mother 33  . Diabetes Paternal Grandfather    Social History   Socioeconomic History  . Marital status: Married    Spouse name: Not on file  . Number of children: Not on file  . Years of education: Not on file  . Highest education level: Not on file  Occupational History  . Not on file  Social Needs  . Financial resource strain: Not on file  . Food insecurity:    Worry: Not on file    Inability: Not on file  . Transportation needs:    Medical: Not on file    Non-medical: Not on file  Tobacco Use  . Smoking status: Never Smoker  . Smokeless tobacco: Never Used  Substance and Sexual Activity  . Alcohol use: Yes    Comment: glass of wine occasionally  . Drug use: No  . Sexual activity: Yes    Birth control/protection: None  Lifestyle  . Physical activity:    Days per week: Not on file    Minutes per session: Not on file  . Stress: Not on file  Relationships  . Social connections:    Talks on phone: Not on file   Gets together: Not on file    Attends religious service: Not on file    Active member of club or organization: Not on file    Attends meetings of clubs or organizations: Not on file    Relationship status: Not on file  Other Topics Concern  . Not on file  Social History Narrative  . Not on file    Outpatient Encounter Medications as of 08/14/2018  Medication Sig  . Calcium Carbonate-Vitamin D (CALTRATE 600+D PO) Take by mouth.  . fluticasone (FLONASE) 50 MCG/ACT nasal spray USE 2 SPRAYS IN EACH NOSTRIL DAILY  . Multiple Vitamins-Minerals (CENTRUM SILVER PO) Take by mouth.  . pravastatin (PRAVACHOL) 10 MG tablet TAKE 1 TABLET BY MOUTH EVERY DAY  . predniSONE (DELTASONE) 10 MG tablet Take 6 tablets x 1 day and then decrease by 1/2 tablet per day until down to zero mg.  . Red Yeast Rice Extract (RED YEAST RICE PO) Take by mouth.  . [DISCONTINUED] albuterol (PROVENTIL HFA;VENTOLIN HFA) 108 (90 Base) MCG/ACT inhaler Inhale 2 puffs into the lungs every 6 (six) hours as needed for wheezing or  shortness of breath.  . [DISCONTINUED] cetirizine (ZYRTEC) 10 MG tablet Take 10 mg by mouth daily.  . [DISCONTINUED] chlorpheniramine-HYDROcodone (TUSSIONEX PENNKINETIC ER) 10-8 MG/5ML SUER Take 5 mLs by mouth at bedtime as needed for cough.  . [DISCONTINUED] ciprofloxacin-dexamethasone (CIPRODEX) OTIC suspension Apply 1-2 drops to toe after soaking BIC  . [DISCONTINUED] clarithromycin (BIAXIN) 250 MG tablet Take 1 tablet (250 mg total) by mouth 2 (two) times daily.  . [DISCONTINUED] pravastatin (PRAVACHOL) 10 MG tablet TAKE 1 TABLET BY MOUTH EVERY DAY  . [DISCONTINUED] predniSONE (DELTASONE) 10 MG tablet Take 6 tablets x 1 day and then decrease by 1/2 tablet per day until down to zero mg.  . [DISCONTINUED] predniSONE (DELTASONE) 10 MG tablet 6 tablets on Days 1 and 2 and 3 , , then reduce by 1 tablet  daily until gone  . doxycycline (VIBRA-TABS) 100 MG tablet Take 1 tablet (100 mg total) by mouth 2 (two)  times daily.  . [DISCONTINUED] benzonatate (TESSALON) 200 MG capsule Take 1 capsule (200 mg total) by mouth 3 (three) times daily as needed for cough. (Patient not taking: Reported on 08/14/2018)   Facility-Administered Encounter Medications as of 08/14/2018  Medication  . albuterol (PROVENTIL) (2.5 MG/3ML) 0.083% nebulizer solution 2.5 mg    Review of Systems  Constitutional: Negative for appetite change and unexpected weight change.  HENT: Negative for congestion and sinus pressure.   Eyes: Negative for pain and visual disturbance.  Respiratory: Negative for cough, chest tightness and shortness of breath.   Cardiovascular: Negative for chest pain, palpitations and leg swelling.  Gastrointestinal: Negative for abdominal pain, diarrhea, nausea and vomiting.  Genitourinary: Negative for difficulty urinating and dysuria.  Musculoskeletal: Negative for back pain, joint swelling and myalgias.  Skin: Negative for color change and rash.  Neurological: Negative for dizziness, light-headedness and headaches.  Hematological: Negative for adenopathy. Does not bruise/bleed easily.  Psychiatric/Behavioral: Negative for agitation, decreased concentration and dysphoric mood.       Objective:    Physical Exam  Constitutional: She is oriented to person, place, and time. She appears well-developed and well-nourished. No distress.  HENT:  Nose: Nose normal.  Mouth/Throat: Oropharynx is clear and moist.  Eyes: Right eye exhibits no discharge. Left eye exhibits no discharge. No scleral icterus.  Neck: Neck supple. No thyromegaly present.  Cardiovascular: Normal rate and regular rhythm.  Pulmonary/Chest: Breath sounds normal. No accessory muscle usage. No tachypnea. No respiratory distress. She has no decreased breath sounds. She has no wheezes. She has no rhonchi. Right breast exhibits no inverted nipple, no mass, no nipple discharge and no tenderness (no axillary adenopathy). Left breast exhibits no  inverted nipple, no mass, no nipple discharge and no tenderness (no axilarry adenopathy).  Abdominal: Soft. Bowel sounds are normal. There is no tenderness.  Genitourinary:  Genitourinary Comments: Normal external genitalia.  Vaginal vault without lesions.  S/p hysterectomy.  Pap smear performed.  Could not appreciate any adnexal masses or tenderness.    Musculoskeletal: She exhibits no edema or tenderness.  Lymphadenopathy:    She has no cervical adenopathy.  Neurological: She is alert and oriented to person, place, and time.  Skin: No rash noted. No erythema.  Psychiatric: She has a normal mood and affect. Her behavior is normal.    BP 124/78 (BP Location: Left Arm, Patient Position: Sitting, Cuff Size: Normal)   Pulse 62   Temp 98.4 F (36.9 C) (Oral)   Resp 18   Wt 166 lb 3.2 oz (75.4 kg)  SpO2 98%   BMI 28.31 kg/m  Wt Readings from Last 3 Encounters:  08/14/18 166 lb 3.2 oz (75.4 kg)  02/20/18 165 lb 12.8 oz (75.2 kg)  07/13/17 164 lb 12.8 oz (74.8 kg)     Lab Results  Component Value Date   WBC 3.6 (L) 08/14/2018   HGB 13.8 08/14/2018   HCT 41.3 08/14/2018   PLT 186.0 08/14/2018   GLUCOSE 96 08/14/2018   CHOL 235 (H) 08/14/2018   TRIG 87.0 08/14/2018   HDL 74.70 08/14/2018   LDLCALC 143 (H) 08/14/2018   ALT 17 08/14/2018   AST 19 08/14/2018   NA 137 08/14/2018   K 4.5 08/14/2018   CL 101 08/14/2018   CREATININE 0.78 08/14/2018   BUN 17 08/14/2018   CO2 30 08/14/2018   TSH 1.36 08/14/2018    Mm Screening Breast Tomo Bilateral  Result Date: 08/16/2017 CLINICAL DATA:  Screening. EXAM: 2D DIGITAL SCREENING BILATERAL MAMMOGRAM WITH CAD AND ADJUNCT TOMO COMPARISON:  Previous exam(s). ACR Breast Density Category b: There are scattered areas of fibroglandular density. FINDINGS: There are no findings suspicious for malignancy. Images were processed with CAD. IMPRESSION: No mammographic evidence of malignancy. A result letter of this screening mammogram will be  mailed directly to the patient. RECOMMENDATION: Screening mammogram in one year. (Code:SM-B-01Y) BI-RADS CATEGORY  1: Negative. Electronically Signed   By: Dorise Bullion III M.D   On: 08/16/2017 09:33       Assessment & Plan:   Problem List Items Addressed This Visit    Abnormal liver function tests    Follow liver panel.  Have discussed diet and exercise.        Bronchitis, mucopurulent recurrent ([place])    Has been a recurrent problem.  Saw pulmonary.  Given doxycycline and prednisone to have if flare.  Breathing doing well.  Follow.        Family history of aortic aneurysm    Father had a h/o aortic aneurysm.  She will check her insurance for coverage for aortic ultrasound.        Healthcare maintenance    Physical today 08/14/18.  PAP 08/14/18.  Mammogram 08/16/17 - birads I.  Scheduled for f/u mammogram.  Colonoscopy 08/01/14 - normal.        Hypercholesterolemia    Had intolerance to multiple statin medications.  Low cholesterol diet and exercise.  Follow lipid panel.        Relevant Orders   CBC with Differential/Platelet (Completed)   Hepatic function panel (Completed)   Lipid panel (Completed)   TSH (Completed)   Basic metabolic panel (Completed)   Thrombocytopenia, unspecified ([facility])    Platelet count 08/14/18 - wnl.  Follow.        Other Visit Diagnoses    Visit for screening mammogram    -  Primary   Relevant Orders   MM 3D SCREEN BREAST BILATERAL   Cervical cancer screening       Relevant Orders   Cytology - PAP( [facility]) (Completed)       Einar Pheasant, MD

## 2018-08-14 NOTE — Assessment & Plan Note (Signed)
Physical today 08/14/18.  PAP 08/14/18.  Mammogram 08/16/17 - birads I.  Scheduled for f/u mammogram.  Colonoscopy 08/01/14 - normal.

## 2018-08-15 LAB — CYTOLOGY - PAP
DIAGNOSIS: NEGATIVE
HPV: NOT DETECTED

## 2018-08-16 ENCOUNTER — Encounter: Payer: Self-pay | Admitting: Internal Medicine

## 2018-08-19 ENCOUNTER — Encounter: Payer: Self-pay | Admitting: Internal Medicine

## 2018-08-19 NOTE — Assessment & Plan Note (Signed)
Had intolerance to multiple statin medications.  Low cholesterol diet and exercise.  Follow lipid panel.  

## 2018-08-19 NOTE — Assessment & Plan Note (Signed)
Platelet count 08/14/18 - wnl.  Follow.

## 2018-08-19 NOTE — Assessment & Plan Note (Signed)
Follow liver panel.  Have discussed diet and exercise.

## 2018-08-19 NOTE — Assessment & Plan Note (Signed)
Has been a recurrent problem.  Saw pulmonary.  Given doxycycline and prednisone to have if flare.  Breathing doing well.  Follow.

## 2018-08-19 NOTE — Assessment & Plan Note (Signed)
Father had a h/o aortic aneurysm.  She will check her insurance for coverage for aortic ultrasound.

## 2018-08-23 ENCOUNTER — Encounter: Payer: Self-pay | Admitting: Podiatry

## 2018-08-23 ENCOUNTER — Ambulatory Visit (INDEPENDENT_AMBULATORY_CARE_PROVIDER_SITE_OTHER): Payer: BC Managed Care – PPO | Admitting: Podiatry

## 2018-08-23 DIAGNOSIS — Q828 Other specified congenital malformations of skin: Secondary | ICD-10-CM

## 2018-08-23 DIAGNOSIS — L603 Nail dystrophy: Secondary | ICD-10-CM

## 2018-08-23 NOTE — Progress Notes (Signed)
She presents today for follow-up of her nail fungus hallux left states that that the medicine underneath the nail and I taken the pills nothing is really happened she states to get some discomfort have to keep it cut and pain it.  She is also concerned about a lesion to the plantar lateral aspect of the right heel that has been seen by her dermatologist stating that it was a wart and she put some type of acid on it she says she states is becoming a little more tender again want me to take a look at it.  Objective: Vital signs are stable alert and oriented x3.  Pulses are palpable.  Hallux left appears to really be no change it does appear to be more of a dystrophic nail than a fungal nail at this point primarily because is much thinner there is no surrounding soft tissue infection and the nail appears to be just detached from the distal aspect of the nailbed.  No other nails appear to be involved.  Plantar lateral aspect of the right heel does demonstrate what appears to be a porokeratotic lesion upon debridement there are no thrombosed capillaries visible skin lines do not circumvent the lesion as warts do.  At this point appears to be more of a poor keratoma that has responded to some type of chemical treatment.  Assessment: Hallux left nail dystrophy.  Porokeratosis plantar aspect right foot.  Plan: Chemical destruction of the lesion today under occlusion after thorough debridement.  She will leave this on until tomorrow morning and washed off thoroughly I will follow-up with her in 6 weeks to see if she has healed this lesion completely.

## 2018-09-12 ENCOUNTER — Ambulatory Visit
Admission: RE | Admit: 2018-09-12 | Discharge: 2018-09-12 | Disposition: A | Discharge status: Home / Self Care | Payer: BC Managed Care – PPO | Source: Ambulatory Visit | Attending: Internal Medicine | Admitting: Internal Medicine

## 2018-09-12 DIAGNOSIS — Z1231 Encounter for screening mammogram for malignant neoplasm of breast: Secondary | ICD-10-CM | POA: Diagnosis not present

## 2018-10-12 ENCOUNTER — Ambulatory Visit: Payer: Self-pay

## 2018-10-12 ENCOUNTER — Other Ambulatory Visit: Payer: Self-pay | Admitting: Internal Medicine

## 2018-10-12 MED ORDER — OSELTAMIVIR PHOSPHATE 75 MG PO CAPS: 75.0000 mg | ORAL_CAPSULE | Freq: Every day | ORAL | 0 refills | Status: DC

## 2018-10-12 NOTE — Progress Notes (Signed)
rx sent in for tamiflu #10 with no refills.   

## 2018-10-12 NOTE — Telephone Encounter (Signed)
rx sent in for tamiflu.

## 2018-10-12 NOTE — Telephone Encounter (Signed)
Called pt.  She is staying in the hospital with her mother who has the flu.  In direct contact with her mother.  Discussed prophylaxis with tamiflu.  Discussed taking tamiflu q day x 10 day.  Will change to treatment dose if symptoms develop.

## 2018-10-12 NOTE — Telephone Encounter (Signed)
Patient called and says she was exposed to the flu by her mother who was hospitalized last Thursday, 10/05/18 for the flu and respiratory problems. She says her mother is still in the hospital. She says earlier this week she had a scratchy throat, but it went away and she feels fine now. She's requesting Tamiflu, if Dr. Nicki Reaper agrees. I advised someone will call with her recommendation, she verbalized understanding.  Reason for Disposition . [1] Influenza EXPOSURE (Close Contact) within last 48 hours (2 days) AND [2] exposed person is HIGH RISK (e.g., age > 57 years, pregnant, HIV+, chronic medical condition)  Answer Assessment - Initial Assessment Questions 1. TYPE of EXPOSURE: "How were you exposed?" (e.g., close contact, not a close contact)     Close contact with mother 2. DATE of EXPOSURE: "When did the exposure occur?" (e.g., hour, days, weeks)     Last Thursday mom was admitted to the hospital and diagnosed flu 3. PREGNANCY: "Is there any chance you are pregnant?" "When was your last menstrual period?"     No 4. HIGH RISK for COMPLICATIONS: "Do you have any heart or lung problems? Do you have a weakened immune system?" (e.g., CHF, COPD, asthma, HIV positive, chemotherapy, renal failure, diabetes mellitus, sickle cell anemia)     Lung problems-bronchitis, pneumonia 5. SYMPTOMS: "Do you have any symptoms?" (e.g., cough, fever, sore throat, difficulty breathing).     No; throat was scratchy a few days ago, but ok now  Protocols used: INFLUENZA EXPOSURE-A-AH

## 2018-11-04 ENCOUNTER — Other Ambulatory Visit: Payer: Self-pay | Admitting: Internal Medicine

## 2019-03-05 ENCOUNTER — Ambulatory Visit: Payer: BC Managed Care – PPO | Admitting: Internal Medicine

## 2019-04-10 ENCOUNTER — Telehealth: Payer: Self-pay | Admitting: Internal Medicine

## 2019-04-10 MED ORDER — OMEPRAZOLE 20 MG PO CPDR: 20.0000 mg | DELAYED_RELEASE_CAPSULE | Freq: Every day | ORAL | 1 refills | Status: DC

## 2019-04-10 NOTE — Telephone Encounter (Signed)
Medication Refill - Medication: omeprazole (PRILOSEC) 20 MG capsule    Has the patient contacted their pharmacy? Yes.   (Agent: If no, request that the patient contact the pharmacy for the refill.) (Agent: If yes, when and what did the pharmacy advise?)  Preferred Pharmacy (with phone number or street name):  [id number] [address] [place], [place] - [address]  [place] [address]  Phone: [phone] Fax: [phone]   Agent: Please be advised that RX refills may take up to 3 business days. We ask that you follow-up with your pharmacy.

## 2019-04-10 NOTE — Telephone Encounter (Signed)
Pt stated that she uses this prn. Has been on it in the past but looks like it was written by historical provider. No acute issues at this time but she would like rx sent in to have. Ok to do so?

## 2019-04-10 NOTE — Telephone Encounter (Signed)
I have sent in rx for omeprazole.  Need to confirm pt doing ok and not having increased problems with acid reflux.

## 2019-04-11 NOTE — Telephone Encounter (Signed)
Pt aware. No increased problems with acid reflux. Will let us know if she needs anything.

## 2019-05-01 ENCOUNTER — Ambulatory Visit (INDEPENDENT_AMBULATORY_CARE_PROVIDER_SITE_OTHER): Payer: BC Managed Care – PPO | Admitting: Internal Medicine

## 2019-05-01 ENCOUNTER — Encounter: Payer: Self-pay | Admitting: Internal Medicine

## 2019-05-01 ENCOUNTER — Other Ambulatory Visit: Payer: Self-pay

## 2019-05-01 DIAGNOSIS — R945 Abnormal results of liver function studies: Secondary | ICD-10-CM

## 2019-05-01 DIAGNOSIS — J411 Mucopurulent chronic bronchitis: Secondary | ICD-10-CM

## 2019-05-01 DIAGNOSIS — E7800 Pure hypercholesterolemia, unspecified: Secondary | ICD-10-CM

## 2019-05-01 DIAGNOSIS — D696 Thrombocytopenia, unspecified: Secondary | ICD-10-CM | POA: Diagnosis not present

## 2019-05-01 DIAGNOSIS — R432 Parageusia: Secondary | ICD-10-CM

## 2019-05-01 DIAGNOSIS — R7989 Other specified abnormal findings of blood chemistry: Secondary | ICD-10-CM

## 2019-05-01 NOTE — Progress Notes (Signed)
[name], female   DOB: Aug 07, 1956, 63 y.o.   MRN: [id number]   Virtual Visit via video Note  This visit type was conducted due to national recommendations for restrictions regarding the COVID-19 pandemic (e.g. social distancing).  This format is felt to be most appropriate for this patient at this time.  All issues noted in this document were discussed and addressed.  No physical exam was performed (except for noted visual exam findings with Video Visits).   I connected with Brenda Burns by a video enabled telemedicine application and verified that I am speaking with the correct person using two identifiers. Location patient: home Location provider: work or home office Persons participating in the virtual visit: patient, provider  I discussed the limitations, risks, security and privacy concerns of performing an evaluation and management service by video and the availability of in person appointments.  The patient expressed understanding and agreed to proceed.   Reason for visit: scheduled follow up.   HPI: She reports her husband is in the hospital with COVID.  He had new onset afib, hypoxia.  Reports her mother has pneumonia and tested positive for covid.  She is waiting on her test results.  She has lost her sense of taste and smell.  Had GI issues last week - acute.  GI issues better now.  Eating.  No nausea or vomiting.  No chest pain.  No sob.  No fever.  No abdominal pain currently.  She is starting to feel better.  Increased stress related to the above.  Discussed with her today.  Overall she feels she is handling things relatively well.     ROS: See pertinent positives and negatives per HPI.  Past Medical History:  Diagnosis Date  . Arthritis   . Asthma   . Chronic sinusitis with recurrent bronchitis   . Hx of basal cell carcinoma    multiple sites  . Hx of melanoma in situ 11/26/2009   L buttock    Past Surgical History:  Procedure Laterality Date  .  ABDOMINAL HYSTERECTOMY  2004   endometriosis  . TONSILLECTOMY  1060's    Family History  Problem Relation Age of Onset  . COPD Mother   . Cancer Mother        breast and colon  . Breast cancer Mother 26  . Diabetes Paternal Grandfather     SOCIAL HX: reviewed.    Current Outpatient Medications:  .  Calcium Carbonate-Vitamin D (CALTRATE 600+D PO), Take by mouth., Disp: , Rfl:  .  fluticasone (FLONASE) 50 MCG/ACT nasal spray, USE 2 SPRAYS IN EACH NOSTRIL DAILY, Disp: 16 g, Rfl: 3 .  Multiple Vitamins-Minerals (CENTRUM SILVER PO), Take by mouth., Disp: , Rfl:  .  omeprazole (PRILOSEC) 20 MG capsule, Take 1 capsule (20 mg total) by mouth daily., Disp: 30 capsule, Rfl: 1 .  pravastatin (PRAVACHOL) 10 MG tablet, TAKE 1 TABLET BY MOUTH EVERY DAY, Disp: 90 tablet, Rfl: 0 .  Red Yeast Rice Extract (RED YEAST RICE PO), Take by mouth., Disp: , Rfl:   Current Facility-Administered Medications:  .  albuterol (PROVENTIL) (2.5 MG/3ML) 0.083% nebulizer solution 2.5 mg, 2.5 mg, Nebulization, Once, Crecencio Mc, MD  EXAM:  GENERAL: alert, oriented, appears well and in no acute distress  HEENT: atraumatic, conjunttiva clear, no obvious abnormalities on inspection of external nose and ears  NECK: normal movements of the head and neck  LUNGS: on inspection no signs of respiratory distress, breathing rate appears  normal, no obvious gross SOB, gasping or wheezing  CV: no obvious cyanosis  PSYCH/NEURO: pleasant and cooperative, no obvious depression or anxiety, speech and thought processing grossly intact  ASSESSMENT AND PLAN:  Discussed the following assessment and plan:  Abnormal liver function tests Have discussed diet and exercise.  Follow liver panel.    Bronchitis, mucopurulent recurrent Has been a recurrent problem.  Saw pulmonary.  Was given doxycycline and prednisone to have if needed.  Currently no sob.  Follow.    Hypercholesterolemia Had intolerance to multiple statin  medications.  Low cholesterol diet and exercise.  Follow lipid panel.    Thrombocytopenia, unspecified Follow cbc.   Loss of taste Husband with covid.  Mother with covid.  She has had symptoms including loss of taste and smell.  Breathing stable.  No sob.  GI symptoms better.  COVID pending.  Discussed self quarantine.  Follow.  Keep me posted on symptoms.      I discussed the assessment and treatment plan with the patient. The patient was provided an opportunity to ask questions and all were answered. The patient agreed with the plan and demonstrated an understanding of the instructions.   The patient was advised to call back or seek an in-person evaluation if the symptoms worsen or if the condition fails to improve as anticipated.   Einar Pheasant, MD

## 2019-05-06 ENCOUNTER — Telehealth: Payer: Self-pay | Admitting: Internal Medicine

## 2019-05-06 ENCOUNTER — Encounter: Payer: Self-pay | Admitting: Internal Medicine

## 2019-05-06 DIAGNOSIS — R432 Parageusia: Secondary | ICD-10-CM | POA: Insufficient documentation

## 2019-05-06 NOTE — Assessment & Plan Note (Signed)
Had intolerance to multiple statin medications.  Low cholesterol diet and exercise.  Follow lipid panel.  

## 2019-05-06 NOTE — Assessment & Plan Note (Signed)
Have discussed diet and exercise.  Follow liver panel.

## 2019-05-06 NOTE — Assessment & Plan Note (Signed)
Husband with covid.  Mother with covid.  She has had symptoms including loss of taste and smell.  Breathing stable.  No sob.  GI symptoms better.  COVID pending.  Discussed self quarantine.  Follow.  Keep me posted on symptoms.

## 2019-05-06 NOTE — Assessment & Plan Note (Signed)
Has been a recurrent problem.  Saw pulmonary.  Was given doxycycline and prednisone to have if needed.  Currently no sob.  Follow.

## 2019-05-06 NOTE — Telephone Encounter (Signed)
My chart message sent to pt for update.   

## 2019-05-06 NOTE — Assessment & Plan Note (Signed)
Follow cbc.  

## 2019-05-11 ENCOUNTER — Other Ambulatory Visit: Payer: Self-pay | Admitting: Internal Medicine

## 2019-06-04 ENCOUNTER — Emergency Department: Payer: BC Managed Care – PPO

## 2019-06-04 ENCOUNTER — Other Ambulatory Visit: Payer: Self-pay

## 2019-06-04 ENCOUNTER — Emergency Department
Admission: EM | Admit: 2019-06-04 | Discharge: 2019-06-04 | Disposition: A | Discharge status: Home / Self Care | Payer: BC Managed Care – PPO | Attending: Emergency Medicine | Admitting: Emergency Medicine

## 2019-06-04 ENCOUNTER — Encounter: Payer: Self-pay | Admitting: Emergency Medicine

## 2019-06-04 DIAGNOSIS — Y9389 Activity, other specified: Secondary | ICD-10-CM | POA: Insufficient documentation

## 2019-06-04 DIAGNOSIS — Y999 Unspecified external cause status: Secondary | ICD-10-CM | POA: Insufficient documentation

## 2019-06-04 DIAGNOSIS — Z79899 Other long term (current) drug therapy: Secondary | ICD-10-CM | POA: Diagnosis not present

## 2019-06-04 DIAGNOSIS — W174XXA Fall from dock, initial encounter: Secondary | ICD-10-CM | POA: Diagnosis not present

## 2019-06-04 DIAGNOSIS — S7011XA Contusion of right thigh, initial encounter: Secondary | ICD-10-CM | POA: Insufficient documentation

## 2019-06-04 DIAGNOSIS — S060X1A Concussion with loss of consciousness of 30 minutes or less, initial encounter: Secondary | ICD-10-CM | POA: Diagnosis not present

## 2019-06-04 DIAGNOSIS — Z85828 Personal history of other malignant neoplasm of skin: Secondary | ICD-10-CM | POA: Diagnosis not present

## 2019-06-04 DIAGNOSIS — Y92814 Boat as the place of occurrence of the external cause: Secondary | ICD-10-CM | POA: Insufficient documentation

## 2019-06-04 DIAGNOSIS — S0993XA Unspecified injury of face, initial encounter: Secondary | ICD-10-CM | POA: Diagnosis present

## 2019-06-04 DIAGNOSIS — S0181XA Laceration without foreign body of other part of head, initial encounter: Secondary | ICD-10-CM | POA: Diagnosis not present

## 2019-06-04 DIAGNOSIS — Z85820 Personal history of malignant melanoma of skin: Secondary | ICD-10-CM | POA: Insufficient documentation

## 2019-06-04 DIAGNOSIS — S0990XA Unspecified injury of head, initial encounter: Secondary | ICD-10-CM

## 2019-06-04 DIAGNOSIS — J45909 Unspecified asthma, uncomplicated: Secondary | ICD-10-CM | POA: Insufficient documentation

## 2019-06-04 MED ORDER — ONDANSETRON 4 MG PO TBDP: 4.0000 mg | ORAL_TABLET | Freq: Three times a day (TID) | ORAL | 0 refills | Status: DC | PRN

## 2019-06-04 NOTE — ED Triage Notes (Signed)
Patient states she mis-stepped on a dock last night and fell and hit her forehead on the dock. +LOC. Patient reports this morning she had some mild nausea and has had headache today.

## 2019-06-04 NOTE — ED Provider Notes (Signed)
[facility] Emergency Department Provider Note  ____________________________________________  Time seen: Approximately 9:11 PM  I have reviewed the triage vital signs and the nursing notes.   HISTORY  Chief Complaint Fall and Loss of Consciousness    HPI Brenda Burns is a 63 y.o. female who presents the emergency department for evaluation of head injury.  Patient was at her [place] with her husband yesterday when she attempted to step off the boat towards the dock.  Patient missed her step, falling and striking her head.  Patient did lose consciousness for approximately 5 minutes.  Patient states that she refused to be seen last night as she woke up this morning with ongoing headache and nausea she decided to be evaluated.  Patient has had no subsequent loss of consciousness.  She reports that she has a frontal headache right behind the area that she struck her head.  She did sustain a laceration to her forehead that was treated at home with Dermabond equivalent.  Patient is having no blurred vision, double vision, neck pain, chest pain, shortness of breath, no return of nausea or emesis after this morning.  No history of head injury.  Patient has recently recovered from COVID-19, has a history of hypercholesterolemia, thrombocytopenia, endometriosis, asthma.  No complaints with chronic medical problems.         Past Medical History:  Diagnosis Date  . Arthritis   . Asthma   . Chronic sinusitis with recurrent bronchitis   . Hx of basal cell carcinoma    multiple sites  . 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 Loss of taste 05/06/2019  . Family history of aortic aneurysm 07/16/2017  . Acute bronchitis 05/12/2016  . Healthcare maintenance 05/12/2016  . Hypercholesterolemia 05/12/2016  . Thrombocytopenia, unspecified ([place]) 06/16/2014  . Abnormal liver function tests 06/16/2014  .  History of ischemic colitis 05/08/2014  . Arthritis 03/10/2014  . Endometriosis 03/10/2014  . Abnormal chest x-ray 12/03/2013  . Bronchitis, mucopurulent recurrent ([place]) 10/29/2013  . CAP (community acquired pneumonia) 10/29/2013  . Allergic rhinitis 10/29/2013    Past Surgical History:  Procedure Laterality Date  . ABDOMINAL HYSTERECTOMY  2004   endometriosis  . TONSILLECTOMY  1060's    Prior to Admission medications   Medication Sig Start Date End Date Taking? Authorizing Provider  Calcium Carbonate-Vitamin D (CALTRATE 600+D PO) Take by mouth.    [provider]  fluticasone (FLONASE) 50 MCG/ACT nasal spray USE 2 SPRAYS IN EACH NOSTRIL DAILY 06/15/16   Einar Pheasant, MD  Multiple Vitamins-Minerals (CENTRUM SILVER PO) Take by mouth.    [provider]  omeprazole (PRILOSEC) 20 MG capsule TAKE 1 CAPSULE BY MOUTH EVERY DAY 05/14/19   Einar Pheasant, MD  ondansetron (ZOFRAN-ODT) 4 MG disintegrating tablet Take 1 tablet (4 mg total) by mouth every 8 (eight) hours as needed for nausea or vomiting. 06/04/19   Claudine Stallings, Charline Bills, PA-C  pravastatin (PRAVACHOL) 10 MG tablet TAKE 1 TABLET BY MOUTH EVERY DAY 11/06/18   Einar Pheasant, MD  Red Yeast Rice Extract (RED YEAST RICE PO) Take by mouth.    [provider]    Allergies Penicillins  Family History  Problem Relation Age of Onset  . COPD Mother   . Cancer Mother        breast and colon  . Breast cancer Mother 25  . Diabetes Paternal Grandfather  Social History Social History   Tobacco Use  . Smoking status: Never Smoker  . Smokeless tobacco: Never Used  Substance Use Topics  . Alcohol use: Yes    Comment: glass of wine occasionally  . Drug use: No     Review of Systems  Constitutional: No fever/chills Eyes: No visual changes. No discharge ENT: No upper respiratory complaints. Cardiovascular: no chest pain. Respiratory: no cough. No SOB. Gastrointestinal: No abdominal pain.  No  nausea, no vomiting.   Musculoskeletal: Negative for musculoskeletal pain. Skin: Negative for rash, abrasions, lacerations, ecchymosis. Neurological: Positive for head injury with frontal headache.  No focal weakness or numbness. 10-point ROS otherwise negative.  ____________________________________________   PHYSICAL EXAM:  VITAL SIGNS: ED Triage Vitals  Enc Vitals Group     BP 06/04/19 2019 (!) 164/80     Pulse Rate 06/04/19 2019 (!) 58     Resp 06/04/19 2019 18     Temp 06/04/19 2019 97.9 F (36.6 C)     Temp Source 06/04/19 2019 Oral     SpO2 06/04/19 2019 99 %     Weight 06/04/19 1758 162 lb (73.5 kg)     Height 06/04/19 1758 5\' 5"  (1.651 m)     Head Circumference --      Peak Flow --      Pain Score 06/04/19 1758 4     Pain Loc --      Pain Edu? --      Excl. in [place]? --      Constitutional: Alert and oriented. Well appearing and in no acute distress. Eyes: Conjunctivae are normal. PERRL. EOMI. Head: Small laceration noted to the right eyebrow.  This has Dermabond applied prior to arrival.  No gross edema or ecchymosis around this area.  Area is tender to palpation but no other tenderness to palpation of the osseous structures of the skull and face.  No battle signs, raccoon eyes, serosanguineous fluid drainage from ears or nares. ENT:      Ears:       Nose: No congestion/rhinnorhea.      Mouth/Throat: Mucous membranes are moist.  Neck: No stridor.   No cervical spine tenderness to palpation. Cardiovascular: Normal rate, regular rhythm. Normal S1 and S2.  Good peripheral circulation. Respiratory: Normal respiratory effort without tachypnea or retractions. Lungs CTAB. Good air entry to the bases with no decreased or absent breath sounds. Musculoskeletal: Full range of motion to all extremities. No gross deformities appreciated.  Examination of the right thigh reveals findings consistent with bruising to the right thigh.  Full range of motion to the hip, knee.  No  tenderness to palpation over the hip or knee.  Findings are consistent with contusion.  Dorsalis pedis pulse intact distally.  Sensation intact distally. Neurologic:  Normal speech and language. No gross focal neurologic deficits are appreciated.  Cranial nerves II through XII grossly intact. Skin:  Skin is warm, dry and intact. No rash noted. Psychiatric: Mood and affect are normal. Speech and behavior are normal. Patient exhibits appropriate insight and judgement.   ____________________________________________   LABS (all labs ordered are listed, but only abnormal results are displayed)  Labs Reviewed - No data to display ____________________________________________  EKG   ____________________________________________  RADIOLOGY I personally viewed and evaluated these images as part of my medical decision making, as well as reviewing the written report by the radiologist.  Ct Head Wo Contrast  Result Date: 06/04/2019 CLINICAL DATA:  Patient states she mis-stepped on a  dock last night and fell and hit her forehead on the dock. +LOC. Patient reports this morning she had some mild nausea and has had headache today EXAM: CT HEAD WITHOUT CONTRAST TECHNIQUE: Contiguous axial images were obtained from the base of the skull through the vertex without intravenous contrast. COMPARISON:  None. FINDINGS: Brain: No evidence of acute infarction, hemorrhage, hydrocephalus, extra-axial collection or mass lesion/mass effect. Vascular: No hyperdense vessel or unexpected calcification. Skull: Normal. Negative for fracture or focal lesion. Sinuses/Orbits: No acute finding. Other: None. IMPRESSION: Negative exam. Electronically Signed   By: Nolon Nations M.D.   On: 06/04/2019 19:15    ____________________________________________    PROCEDURES  Procedure(s) performed:    Procedures    Medications - No data to display   ____________________________________________   INITIAL IMPRESSION /  ASSESSMENT AND PLAN / ED COURSE  Pertinent labs & imaging results that were available during my care of the patient were reviewed by me and considered in my medical decision making (see chart for details).  Review of the [place] CSRS was performed in accordance of the [place] prior to dispensing any controlled drugs.           Patient's diagnosis is consistent with fall, head injury, concussion.  Patient fell last night, striking her head and sustaining a loss of consciousness.  Patient had ongoing headache, nausea and emesis this morning.  Overall, exam is reassuring with patient being neurologically intact.  CT scan reveals no acute skull fracture, no intracranial hemorrhage.  At this time, patient will be given medication for her nausea and emesis.  Symptoms are consistent with a concussion.  I have discussed postconcussion symptoms with the patient.  She is to follow-up with her primary care and if symptoms do not resolve she will follow-up with neurology.  Patient is given ED precautions to return to the ED for any worsening or new symptoms.     ____________________________________________  FINAL CLINICAL IMPRESSION(S) / ED DIAGNOSES  Final diagnoses:  Concussion with loss of consciousness of 30 minutes or less, initial encounter  Injury of head, initial encounter  Contusion of right thigh, initial encounter      NEW MEDICATIONS STARTED DURING THIS VISIT:  ED Discharge Orders         Ordered    ondansetron (ZOFRAN-ODT) 4 MG disintegrating tablet  Every 8 hours PRN     06/04/19 2140              This chart was dictated using voice recognition software/Dragon. Despite best efforts to proofread, errors can occur which can change the meaning. Any change was purely unintentional.    Darletta Moll, PA-C 06/04/19 2149    Blake Divine, MD 06/05/19 [phone]

## 2019-06-14 ENCOUNTER — Encounter: Payer: Self-pay | Admitting: Internal Medicine

## 2019-06-14 NOTE — Telephone Encounter (Signed)
Please call pt and see if she feels needs f/u appt or anything.  Keep [place] posted.

## 2019-06-15 NOTE — Telephone Encounter (Signed)
Confirmed pt doing okay. Stated that she did not feel f/u is needed at this time. She has a lot going on with her husband. She is due for a cpe in November and she will need labs as well. Pt will come fasting to appt and have labs done then per her request. Advised patient to let us know if she needs anything before then.

## 2019-07-11 ENCOUNTER — Other Ambulatory Visit: Payer: Self-pay | Admitting: Internal Medicine

## 2019-08-13 ENCOUNTER — Other Ambulatory Visit: Payer: Self-pay | Admitting: Internal Medicine

## 2019-08-13 DIAGNOSIS — Z1231 Encounter for screening mammogram for malignant neoplasm of breast: Secondary | ICD-10-CM

## 2019-08-16 ENCOUNTER — Encounter: Payer: Self-pay | Admitting: Internal Medicine

## 2019-08-16 ENCOUNTER — Ambulatory Visit (INDEPENDENT_AMBULATORY_CARE_PROVIDER_SITE_OTHER): Payer: BC Managed Care – PPO | Admitting: Internal Medicine

## 2019-08-16 ENCOUNTER — Other Ambulatory Visit: Payer: Self-pay

## 2019-08-16 VITALS — BP 118/70 | HR 57 | Temp 97.9°F | Resp 16 | Ht 64.25 in | Wt 164.2 lb

## 2019-08-16 DIAGNOSIS — R945 Abnormal results of liver function studies: Secondary | ICD-10-CM

## 2019-08-16 DIAGNOSIS — Z0000 Encounter for general adult medical examination without abnormal findings: Secondary | ICD-10-CM | POA: Diagnosis not present

## 2019-08-16 DIAGNOSIS — E7800 Pure hypercholesterolemia, unspecified: Secondary | ICD-10-CM | POA: Diagnosis not present

## 2019-08-16 DIAGNOSIS — Z1159 Encounter for screening for other viral diseases: Secondary | ICD-10-CM

## 2019-08-16 DIAGNOSIS — R7989 Other specified abnormal findings of blood chemistry: Secondary | ICD-10-CM

## 2019-08-16 DIAGNOSIS — D696 Thrombocytopenia, unspecified: Secondary | ICD-10-CM

## 2019-08-16 DIAGNOSIS — Z23 Encounter for immunization: Secondary | ICD-10-CM

## 2019-08-16 DIAGNOSIS — R1031 Right lower quadrant pain: Secondary | ICD-10-CM

## 2019-08-16 LAB — CBC WITH DIFFERENTIAL/PLATELET
Basophils Absolute: 0 10*3/uL (ref 0.0–0.1)
Basophils Relative: 1.3 % (ref 0.0–3.0)
Eosinophils Absolute: 0.1 10*3/uL (ref 0.0–0.7)
Eosinophils Relative: 3.2 % (ref 0.0–5.0)
HCT: 41.4 % (ref 36.0–46.0)
Hemoglobin: 13.8 g/dL (ref 12.0–15.0)
Lymphocytes Relative: 36.1 % (ref 12.0–46.0)
Lymphs Abs: 1.2 10*3/uL (ref 0.7–4.0)
MCHC: 33.3 g/dL (ref 30.0–36.0)
MCV: 88.2 fl (ref 78.0–100.0)
Monocytes Absolute: 0.3 10*3/uL (ref 0.1–1.0)
Monocytes Relative: 7.5 % (ref 3.0–12.0)
Neutro Abs: 1.8 10*3/uL (ref 1.4–7.7)
Neutrophils Relative %: 51.9 % (ref 43.0–77.0)
Platelets: 189 10*3/uL (ref 150.0–400.0)
RBC: 4.69 Mil/uL (ref 3.87–5.11)
RDW: 14.3 % (ref 11.5–15.5)
WBC: 3.4 10*3/uL — ABNORMAL LOW (ref 4.0–10.5)

## 2019-08-16 LAB — LIPID PANEL
Cholesterol: 242 mg/dL — ABNORMAL HIGH (ref 0–200)
HDL: 69.7 mg/dL (ref >39.00)
LDL Cholesterol: 153 mg/dL — ABNORMAL HIGH (ref 0–99)
NonHDL: 172.72
Total CHOL/HDL Ratio: 3
Triglycerides: 97 mg/dL (ref 0.0–149.0)
VLDL: 19.4 mg/dL (ref 0.0–40.0)

## 2019-08-16 LAB — TSH: TSH: 2.12 u[IU]/mL (ref 0.35–4.50)

## 2019-08-16 LAB — BASIC METABOLIC PANEL
BUN: 17 mg/dL (ref 6–23)
CO2: 30 mEq/L (ref 19–32)
Calcium: 9.2 mg/dL (ref 8.4–10.5)
Chloride: 104 mEq/L (ref 96–112)
Creatinine, Ser: 0.78 mg/dL (ref 0.40–1.20)
GFR: 74.02 mL/min (ref >60.00)
Glucose, Bld: 96 mg/dL (ref 70–99)
Potassium: 4.2 mEq/L (ref 3.5–5.1)
Sodium: 140 mEq/L (ref 135–145)

## 2019-08-16 LAB — HEPATIC FUNCTION PANEL
ALT: 27 U/L (ref 0–35)
AST: 20 U/L (ref 0–37)
Albumin: 4.5 g/dL (ref 3.5–5.2)
Alkaline Phosphatase: 93 U/L (ref 39–117)
Bilirubin, Direct: 0.1 mg/dL (ref 0.0–0.3)
Total Bilirubin: 0.4 mg/dL (ref 0.2–1.2)
Total Protein: 6.8 g/dL (ref 6.0–8.3)

## 2019-08-16 NOTE — Assessment & Plan Note (Addendum)
Physical today 08/16/19.  PAP 08/14/18 - atrophy - negative with negative HPV. S/p hysterectomy.  Mammogram 09/12/18 - Birads I.  Colonoscopy 10/2013

## 2019-08-16 NOTE — Progress Notes (Signed)
[name], female   DOB: Mar 10, 1956, 63 y.o.   MRN: [id number]   Subjective:    [name], female    DOB: 09/11/1956, 63 y.o.   MRN: [id number]  HPI  Patient here for her physical exam.  She reports she is doing relatively well.  Increased stress with her husband's health issues.  She feels she is handling things relatively well.  Trying to stay active.  No chest pain.  No sob.  No acid reflux.  Reports some RLQ pain.  Intermittent.  No constant or severe pain.   Bowels moving.  Previously fall 06/03/19.  Had concussion.  Resolved.  Hematoma right leg.  Resolved.  Indention left in muscle.  No pain.  No swelling.  No cough or congestion.  Scheduled for mammogram 09/2019.     Past Medical History:  Diagnosis Date  . Arthritis   . Asthma   . Chronic sinusitis with recurrent bronchitis   . Hx of basal cell carcinoma    multiple sites  . Hx of melanoma in situ 11/26/2009   L buttock   Past Surgical History:  Procedure Laterality Date  . ABDOMINAL HYSTERECTOMY  2004   endometriosis  . TONSILLECTOMY  1060's   Family History  Problem Relation Age of Onset  . COPD Mother   . Cancer Mother        breast and colon  . Breast cancer Mother 21  . Diabetes Paternal Grandfather    Social History   Socioeconomic History  . Marital status: Married    Spouse name: Not on file  . Number of children: Not on file  . Years of education: Not on file  . Highest education level: Not on file  Occupational History  . Not on file  Social Needs  . Financial resource strain: Not on file  . Food insecurity    Worry: Not on file    Inability: Not on file  . Transportation needs    Medical: Not on file    Non-medical: Not on file  Tobacco Use  . Smoking status: Never Smoker  . Smokeless tobacco: Never Used  Substance and Sexual Activity  . Alcohol use: Yes    Comment: glass of wine occasionally  . Drug use: No  . Sexual activity: Yes    Birth  control/protection: None  Lifestyle  . Physical activity    Days per week: Not on file    Minutes per session: Not on file  . Stress: Not on file  Relationships  . Social Herbalist on phone: Not on file    Gets together: Not on file    Attends religious service: Not on file    Active member of club or organization: Not on file    Attends meetings of clubs or organizations: Not on file    Relationship status: Not on file  Other Topics Concern  . Not on file  Social History Narrative  . Not on file    Outpatient Encounter Medications as of 08/16/2019  Medication Sig  . Calcium Carbonate-Vitamin D (CALTRATE 600+D PO) Take by mouth.  . fluticasone (FLONASE) 50 MCG/ACT nasal spray USE 2 SPRAYS IN EACH NOSTRIL DAILY  . Multiple Vitamins-Minerals (CENTRUM SILVER PO) Take by mouth.  . ondansetron (ZOFRAN-ODT) 4 MG disintegrating tablet Take 1 tablet (4 mg total) by mouth every 8 (eight) hours as needed for nausea or vomiting.  . pravastatin (PRAVACHOL) 10 MG tablet TAKE  1 TABLET BY MOUTH EVERY DAY  . Red Yeast Rice Extract (RED YEAST RICE PO) Take by mouth.  . [DISCONTINUED] omeprazole (PRILOSEC) 20 MG capsule TAKE 1 CAPSULE BY MOUTH EVERY DAY   Facility-Administered Encounter Medications as of 08/16/2019  Medication  . albuterol (PROVENTIL) (2.5 MG/3ML) 0.083% nebulizer solution 2.5 mg   Review of Systems  Constitutional: Negative for appetite change and unexpected weight change.  HENT: Negative for congestion and sinus pressure.   Eyes: Negative for pain and visual disturbance.  Respiratory: Negative for cough, chest tightness and shortness of breath.   Cardiovascular: Negative for chest pain, palpitations and leg swelling.  Gastrointestinal: Negative for diarrhea, nausea and vomiting.       RLQ pain.    Genitourinary: Negative for difficulty urinating and dysuria.  Musculoskeletal: Negative for joint swelling and myalgias.  Skin: Negative for color change and rash.   Neurological: Negative for dizziness, light-headedness and headaches.  Hematological: Negative for adenopathy. Does not bruise/bleed easily.  Psychiatric/Behavioral: Negative for agitation and dysphoric mood.       Objective:    Physical Exam Constitutional:      General: She is not in acute distress.    Appearance: Normal appearance. She is well-developed.  HENT:     Head: Normocephalic and atraumatic.     Right Ear: External ear normal.     Left Ear: External ear normal.  Eyes:     General: No scleral icterus.       Right eye: No discharge.        Left eye: No discharge.     Conjunctiva/sclera: Conjunctivae normal.  Neck:     Musculoskeletal: Neck supple. No muscular tenderness.     Thyroid: No thyromegaly.  Cardiovascular:     Rate and Rhythm: Normal rate and regular rhythm.  Pulmonary:     Effort: No tachypnea, accessory muscle usage or respiratory distress.     Breath sounds: Normal breath sounds. No decreased breath sounds or wheezing.  Chest:     Breasts:        Right: No inverted nipple, mass, nipple discharge or tenderness (no axillary adenopathy).        Left: No inverted nipple, mass, nipple discharge or tenderness (no axilarry adenopathy).  Abdominal:     General: Bowel sounds are normal.     Palpations: Abdomen is soft.     Tenderness: There is no abdominal tenderness.  Musculoskeletal:        General: No swelling or tenderness.  Lymphadenopathy:     Cervical: No cervical adenopathy.  Skin:    Findings: No erythema or rash.  Neurological:     Mental Status: She is alert and oriented to person, place, and time.  Psychiatric:        Mood and Affect: Mood normal.        Behavior: Behavior normal.     BP 118/70   Pulse (!) 57   Temp 97.9 F (36.6 C)   Resp 16   Wt 164 lb 3.2 oz (74.5 kg)   SpO2 97%   BMI 27.32 kg/m  Wt Readings from Last 3 Encounters:  08/16/19 164 lb 3.2 oz (74.5 kg)  06/04/19 162 lb (73.5 kg)  08/14/18 166 lb 3.2 oz (75.4 kg)      Lab Results  Component Value Date   WBC 3.4 (L) 08/16/2019   HGB 13.8 08/16/2019   HCT 41.4 08/16/2019   PLT 189.0 08/16/2019   GLUCOSE 96 08/16/2019   CHOL 242 (  H) 08/16/2019   TRIG 97.0 08/16/2019   HDL 69.70 08/16/2019   LDLCALC 153 (H) 08/16/2019   ALT 27 08/16/2019   AST 20 08/16/2019   NA 140 08/16/2019   K 4.2 08/16/2019   CL 104 08/16/2019   CREATININE 0.78 08/16/2019   BUN 17 08/16/2019   CO2 30 08/16/2019   TSH 2.12 08/16/2019    Ct Head Wo Contrast  Result Date: 06/04/2019 CLINICAL DATA:  Patient states she mis-stepped on a dock last night and fell and hit her forehead on the dock. +LOC. Patient reports this morning she had some mild nausea and has had headache today EXAM: CT HEAD WITHOUT CONTRAST TECHNIQUE: Contiguous axial images were obtained from the base of the skull through the vertex without intravenous contrast. COMPARISON:  None. FINDINGS: Brain: No evidence of acute infarction, hemorrhage, hydrocephalus, extra-axial collection or mass lesion/mass effect. Vascular: No hyperdense vessel or unexpected calcification. Skull: Normal. Negative for fracture or focal lesion. Sinuses/Orbits: No acute finding. Other: None. IMPRESSION: Negative exam. Electronically Signed   By: Nolon Nations M.D.   On: 06/04/2019 19:15       Assessment & Plan:   Problem List Items Addressed This Visit    Abnormal liver function tests - Primary    Have discussed diet and exercise.  Follow liver panel.       Relevant Orders   Hepatic function panel (Completed)   Healthcare maintenance    Physical today 08/16/19.  PAP 08/14/18 - atrophy - negative with negative HPV. S/p hysterectomy.  Mammogram 09/12/18 - Birads I.  Colonoscopy 10/2013      Hypercholesterolemia    Had intolerance to multiple statin medications.  Low cholesterol diet and exercise.  Follow lipid panel.       Relevant Orders   Lipid panel (Completed)   TSH (Completed)   CBC with Differential/Platelet  (Completed)   Basic metabolic panel (today) (Completed)   Right lower quadrant pain    Just intermittent.  Will check urine and routine labs.  Discussed further evaluation and scanning.  She wants to monitor.  Will follow for triggers.  Notify me if persistent or if she changes her mind regarding further w/up.       Relevant Orders   Urinalysis, Routine w reflex microscopic   Thrombocytopenia, unspecified (HCC)    Follow cbc.        Other Visit Diagnoses    Need for hepatitis C screening test       Relevant Orders   Hepatitis C Antibody (Completed)   Need for immunization against influenza       Relevant Orders   Flu Vaccine QUAD High Dose(Fluad) (Completed)       Einar Pheasant, MD

## 2019-08-17 LAB — HEPATITIS C ANTIBODY
Hepatitis C Ab: NONREACTIVE
SIGNAL TO CUT-OFF: 0.01 (ref <1.00)

## 2019-08-19 ENCOUNTER — Encounter: Payer: Self-pay | Admitting: Internal Medicine

## 2019-08-19 NOTE — Assessment & Plan Note (Signed)
Have discussed diet and exercise.  Follow liver panel.   

## 2019-08-19 NOTE — Assessment & Plan Note (Signed)
Follow cbc.  

## 2019-08-19 NOTE — Assessment & Plan Note (Signed)
Had intolerance to multiple statin medications.  Low cholesterol diet and exercise.  Follow lipid panel.

## 2019-08-19 NOTE — Assessment & Plan Note (Signed)
Just intermittent.  Will check urine and routine labs.  Discussed further evaluation and scanning.  She wants to monitor.  Will follow for triggers.  Notify me if persistent or if she changes her mind regarding further w/up.

## 2019-08-21 ENCOUNTER — Encounter: Payer: Self-pay | Admitting: Internal Medicine

## 2019-08-21 DIAGNOSIS — Z1211 Encounter for screening for malignant neoplasm of colon: Secondary | ICD-10-CM

## 2019-08-21 DIAGNOSIS — R1084 Generalized abdominal pain: Secondary | ICD-10-CM

## 2019-08-21 NOTE — Telephone Encounter (Signed)
Will let pt know that this does take some time to process but wanted to follow up with you.Marland Kitchen

## 2019-08-27 ENCOUNTER — Other Ambulatory Visit: Payer: Self-pay

## 2019-08-27 MED ORDER — PRAVASTATIN SODIUM 20 MG PO TABS: 20.0000 mg | ORAL_TABLET | Freq: Every day | ORAL | 1 refills | Status: DC

## 2019-09-05 ENCOUNTER — Ambulatory Visit
Admission: RE | Admit: 2019-09-05 | Discharge: 2019-09-05 | Disposition: A | Discharge status: Home / Self Care | Payer: BC Managed Care – PPO | Source: Ambulatory Visit | Attending: Internal Medicine | Admitting: Internal Medicine

## 2019-09-05 ENCOUNTER — Encounter: Payer: Self-pay | Admitting: Internal Medicine

## 2019-09-05 ENCOUNTER — Other Ambulatory Visit: Payer: Self-pay

## 2019-09-05 DIAGNOSIS — R1084 Generalized abdominal pain: Secondary | ICD-10-CM | POA: Insufficient documentation

## 2019-09-05 MED ORDER — IOHEXOL 300 MG/ML  SOLN
100.0000 mL | Freq: Once | INTRAMUSCULAR | Status: AC | PRN
  Administered 2019-09-05: 100 mL via INTRAVENOUS

## 2019-09-14 ENCOUNTER — Ambulatory Visit
Admission: RE | Admit: 2019-09-14 | Discharge: 2019-09-14 | Disposition: A | Discharge status: Home / Self Care | Payer: BC Managed Care – PPO | Source: Ambulatory Visit | Attending: Internal Medicine | Admitting: Internal Medicine

## 2019-09-14 DIAGNOSIS — Z1231 Encounter for screening mammogram for malignant neoplasm of breast: Secondary | ICD-10-CM | POA: Insufficient documentation

## 2019-10-23 ENCOUNTER — Telehealth: Payer: Self-pay | Admitting: *Deleted

## 2019-10-23 NOTE — Telephone Encounter (Signed)
Assisted in scheduling COVID vaccine, 11/15/2019 0815, verbalized understanding.

## 2019-11-04 ENCOUNTER — Ambulatory Visit: Payer: BC Managed Care – PPO

## 2019-11-10 ENCOUNTER — Ambulatory Visit: Payer: Medicare PPO | Attending: Internal Medicine

## 2019-11-10 DIAGNOSIS — Z23 Encounter for immunization: Secondary | ICD-10-CM

## 2019-11-10 NOTE — Progress Notes (Signed)
   Covid-19 Vaccination Clinic  Name:  Brenda Burns    MRN: [id number] DOB: [birth year]  11/10/2019  [name] was observed post Covid-19 immunization for 15 minutes without incidence. She was provided with Vaccine Information Sheet and instruction to access the V-Safe system.   Ms. Perfect was instructed to call 911 with any severe reactions post vaccine: Marland Kitchen Difficulty breathing  . Swelling of your face and throat  . A fast heartbeat  . A bad rash all over your body  . Dizziness and weakness    Immunizations Administered    Name Date Dose VIS Date Route   Pfizer COVID-19 Vaccine 11/10/2019  9:37 AM 0.3 mL 09/14/2019 Intramuscular   Manufacturer: [place]   Lot: [id number]   [place]: [id number]

## 2019-11-15 ENCOUNTER — Ambulatory Visit: Payer: BC Managed Care – PPO

## 2019-12-04 ENCOUNTER — Ambulatory Visit: Payer: Medicare PPO | Attending: Internal Medicine

## 2019-12-04 DIAGNOSIS — Z23 Encounter for immunization: Secondary | ICD-10-CM | POA: Insufficient documentation

## 2019-12-04 NOTE — Progress Notes (Signed)
   Covid-19 Vaccination Clinic  Name:  Brenda Burns    MRN: [id number] DOB: [birth year]  12/04/2019  [name] was observed post Covid-19 immunization for 15 minutes without incident. She was provided with Vaccine Information Sheet and instruction to access the V-Safe system.   [name] was instructed to call 911 with any severe reactions post vaccine: Marland Kitchen Difficulty breathing  . Swelling of face and throat  . A fast heartbeat  . A bad rash all over body  . Dizziness and weakness   Immunizations Administered    Name Date Dose VIS Date Route   Pfizer COVID-19 Vaccine 12/04/2019  8:24 AM 0.3 mL 09/14/2019 Intramuscular   Manufacturer: [place]   Lot: [id number]   [place]: [id number]

## 2020-01-08 DIAGNOSIS — R109 Unspecified abdominal pain: Secondary | ICD-10-CM | POA: Insufficient documentation

## 2020-02-13 ENCOUNTER — Ambulatory Visit: Payer: Medicare Other | Admitting: Internal Medicine

## 2020-02-22 ENCOUNTER — Other Ambulatory Visit: Payer: Self-pay | Admitting: Internal Medicine

## 2020-03-18 ENCOUNTER — Ambulatory Visit: Payer: Medicare PPO

## 2020-03-31 ENCOUNTER — Ambulatory Visit: Payer: Medicare PPO | Admitting: Internal Medicine

## 2020-05-13 ENCOUNTER — Ambulatory Visit: Payer: Medicare PPO | Admitting: Internal Medicine

## 2020-07-10 ENCOUNTER — Telehealth: Payer: Self-pay

## 2020-07-10 NOTE — Telephone Encounter (Signed)
Patient called the office and spoke with Abby about having a new spot and being a previous Melanoma patient. Patient states Dr. Nicole Kindred would not want her to wait until her November TBSE to have this checked. Dr. Nicole Kindred was going to see patient at the end of her day Wednesday (07/10/20). I called patient and left her a message to return my call so we could bring her in but no answer and no call back.

## 2020-07-30 ENCOUNTER — Ambulatory Visit: Payer: Medicare PPO | Admitting: Internal Medicine

## 2020-08-19 ENCOUNTER — Ambulatory Visit (INDEPENDENT_AMBULATORY_CARE_PROVIDER_SITE_OTHER): Payer: Medicare PPO | Admitting: Dermatology

## 2020-08-19 ENCOUNTER — Other Ambulatory Visit: Payer: Self-pay

## 2020-08-19 DIAGNOSIS — Z85828 Personal history of other malignant neoplasm of skin: Secondary | ICD-10-CM | POA: Diagnosis not present

## 2020-08-19 DIAGNOSIS — Z86007 Personal history of in-situ neoplasm of skin: Secondary | ICD-10-CM

## 2020-08-19 DIAGNOSIS — D489 Neoplasm of uncertain behavior, unspecified: Secondary | ICD-10-CM

## 2020-08-19 DIAGNOSIS — L821 Other seborrheic keratosis: Secondary | ICD-10-CM

## 2020-08-19 DIAGNOSIS — Z1283 Encounter for screening for malignant neoplasm of skin: Secondary | ICD-10-CM

## 2020-08-19 DIAGNOSIS — L578 Other skin changes due to chronic exposure to nonionizing radiation: Secondary | ICD-10-CM

## 2020-08-19 DIAGNOSIS — C44519 Basal cell carcinoma of skin of other part of trunk: Secondary | ICD-10-CM

## 2020-08-19 DIAGNOSIS — D229 Melanocytic nevi, unspecified: Secondary | ICD-10-CM

## 2020-08-19 DIAGNOSIS — L814 Other melanin hyperpigmentation: Secondary | ICD-10-CM

## 2020-08-19 DIAGNOSIS — D1800 Hemangioma unspecified site: Secondary | ICD-10-CM

## 2020-08-19 NOTE — Progress Notes (Deleted)
   Follow-Up Visit   Subjective  Brenda Burns is a 64 y.o. female who presents for the following: Annual Exam (Hx Melanoma IS of the left buttock, Hx BCCs.).   The following portions of the chart were reviewed this encounter and updated as appropriate:      Review of Systems:  No other skin or systemic complaints except as noted in HPI or Assessment and Plan.  Objective  Well appearing patient in no apparent distress; mood and affect are within normal limits.  A full examination was performed including scalp, head, eyes, ears, nose, lips, neck, chest, axillae, abdomen, back, buttocks, bilateral upper extremities, bilateral lower extremities, hands, feet, fingers, toes, fingernails, and toenails. All findings within normal limits unless otherwise noted below.    Assessment & Plan   Skin cancer screening performed today.  Actinic Damage - chronic, secondary to cumulative UV radiation exposure/sun exposure over time - diffuse scaly erythematous macules with underlying dyspigmentation - Recommend daily broad spectrum sunscreen SPF 30+ to sun-exposed areas, reapply every 2 hours as needed.  - Call for new or changing lesions. Melanocytic Nevi - Tan-brown and/or pink-flesh-colored symmetric macules and papules - Benign appearing on exam today - Observation - Call clinic for new or changing moles - Recommend daily use of broad spectrum spf 30+ sunscreen to sun-exposed areas.   Hemangiomas - Red papules - Discussed benign nature - Observe - Call for any changes  Lentigines - Scattered tan macules - Discussed due to sun exposure - Benign, observe - Call for any changes  Seborrheic Keratoses - Stuck-on, waxy, tan-brown papules and plaques  - Discussed benign etiology and prognosis. - Observe - Call for any changes     No follow-ups on file.   IJamesetta Orleans, CMA, am acting as scribe for [name], MD .

## 2020-08-19 NOTE — Patient Instructions (Signed)

## 2020-08-19 NOTE — Progress Notes (Signed)
Follow-Up Visit   Subjective  Brenda Burns is a 64 y.o. female who presents for the following: Annual Exam (Hx Melanoma IS of the left buttock, Hx BCCs.).  Pt states that there is a new spot on her back that she noticed approx 1 month ago. Pt denies any itching or irritation.  There was another spot on her calf that got red and inflamed and then came off.  The following portions of the chart were reviewed this encounter and updated as appropriate:     Review of Systems: No other skin or systemic complaints except as noted in HPI or Assessment and Plan.   Objective  Well appearing patient in no apparent distress; mood and affect are within normal limits.  A full examination was performed including scalp, head, eyes, ears, nose, lips, neck, chest, axillae, abdomen, back, buttocks, bilateral upper extremities, bilateral lower extremities, hands, feet, fingers, toes, fingernails, and toenails. All findings within normal limits unless otherwise noted below.  Objective  left spinal lower back: 7 mm firm flesh papule/nodule- in area of previously treated BCC     Objective  Right Lower calf: 5 x 3 mm waxy tan macule  Assessment & Plan  Neoplasm of uncertain behavior left spinal lower back  Skin / nail biopsy Type of biopsy: tangential   Informed consent: discussed and consent obtained   Anesthesia: the lesion was anesthetized in a standard fashion   Anesthesia comment:  Area prepped with alcohol Anesthetic:  1% lidocaine w/ epinephrine 1-100,000 buffered w/ 8.4% NaHCO3 Instrument used: flexible razor blade   Hemostasis achieved with: pressure, aluminum chloride and electrodesiccation   Outcome: patient tolerated procedure well   Post-procedure details: wound care instructions given   Post-procedure details comment:  Ointment and small bandage applied  Specimen 1 - Surgical pathology Differential Diagnosis: Cyst vs recurrent BCC vs other Check Margins: No 7 mm firm  subcutaneous nodule  Cyst vrs recurrent BCC Would need excision if BCC  Seborrheic keratosis Right Lower calf  Recent hx of inflammation, now improved Benign, observe.     Lentigines - Scattered tan macules - Discussed due to sun exposure - Benign, observe - Call for any changes  Seborrheic Keratoses - Stuck-on, waxy, tan-brown papules and plaques  - Discussed benign etiology and prognosis. - Observe - Call for any changes  Melanocytic Nevi - Tan-brown and/or pink-flesh-colored symmetric macules and papules - Benign appearing on exam today - Observation - Call clinic for new or changing moles - Recommend daily use of broad spectrum spf 30+ sunscreen to sun-exposed areas.   Hemangiomas - Red papules - Discussed benign nature - Observe - Call for any changes  Actinic Damage - Chronic, secondary to cumulative UV/sun exposure - diffuse scaly erythematous macules with underlying dyspigmentation - Recommend daily broad spectrum sunscreen SPF 30+ to sun-exposed areas, reapply every 2 hours as needed.  - Call for new or changing lesions.  Skin cancer screening performed today.  History of Melanoma in Situ - No evidence of recurrence today - Recommend regular full body skin exams - Recommend daily broad spectrum sunscreen SPF 30+ to sun-exposed areas, reapply every 2 hours as needed.  - Call if any new or changing lesions are noted between office visits  History of Basal Cell Carcinoma of the Skin - possible recurrence, see biopsy above - Recommend regular full body skin exams - Recommend daily broad spectrum sunscreen SPF 30+ to sun-exposed areas, reapply every 2 hours as needed.  - Call if any  new or changing lesions are noted between office visits   Return in about 1 year (around 08/19/2021) for TBSE.   I, Harriett Sine, CMA, am acting as scribe for [name], MD.  Documentation: I have reviewed the above documentation for accuracy and completeness, and I  agree with the above.  [name]

## 2020-08-25 ENCOUNTER — Telehealth: Payer: Self-pay

## 2020-08-25 DIAGNOSIS — Z1231 Encounter for screening mammogram for malignant neoplasm of breast: Secondary | ICD-10-CM

## 2020-08-25 NOTE — Telephone Encounter (Signed)
Advised pt of bx results and scheduled her for excision on 10/13/2020 at 3:30/sh

## 2020-08-25 NOTE — Telephone Encounter (Signed)
-----   Message from [name], MD sent at 08/25/2020  9:28 AM EST ----- Skin , left spinal lower back BASAL CELL CARCINOMA, NODULAR PATTERN  Recurrent BCC- needs excision

## 2020-08-25 NOTE — Progress Notes (Signed)
Skin , left spinal lower back BASAL CELL CARCINOMA, NODULAR PATTERN  Recurrent BCC- needs excision

## 2020-08-25 NOTE — Telephone Encounter (Signed)
Pt needs orders for a mammogram after 09/16/20. She said they would not let her schedule until this is in. She would like a call back once the orders are in and she will call to schedule it.

## 2020-08-26 NOTE — Telephone Encounter (Signed)
mammo ordered. Pt aware.

## 2020-09-18 ENCOUNTER — Ambulatory Visit
Admission: RE | Admit: 2020-09-18 | Discharge: 2020-09-18 | Disposition: A | Discharge status: Home / Self Care | Payer: Medicare PPO | Source: Ambulatory Visit | Attending: Internal Medicine | Admitting: Internal Medicine

## 2020-09-18 ENCOUNTER — Other Ambulatory Visit: Payer: Self-pay

## 2020-09-18 DIAGNOSIS — Z1231 Encounter for screening mammogram for malignant neoplasm of breast: Secondary | ICD-10-CM

## 2020-10-13 ENCOUNTER — Other Ambulatory Visit: Payer: Self-pay

## 2020-10-13 ENCOUNTER — Encounter: Payer: Self-pay | Admitting: Dermatology

## 2020-10-13 ENCOUNTER — Ambulatory Visit: Payer: Medicare PPO | Admitting: Dermatology

## 2020-10-13 DIAGNOSIS — L821 Other seborrheic keratosis: Secondary | ICD-10-CM | POA: Diagnosis not present

## 2020-10-13 DIAGNOSIS — C44519 Basal cell carcinoma of skin of other part of trunk: Secondary | ICD-10-CM | POA: Diagnosis not present

## 2020-10-13 DIAGNOSIS — L720 Epidermal cyst: Secondary | ICD-10-CM | POA: Diagnosis not present

## 2020-10-13 NOTE — Patient Instructions (Signed)

## 2020-10-13 NOTE — Progress Notes (Signed)
   Follow-Up Visit   Subjective  Brenda Burns is a 65 y.o. female who presents for the following: BCC bx proven (L spinal lower back, bx proven, pt presents for excision), check spot (R axilla, just found it, no symptoms), and growth (Buttocks, growing).   The following portions of the chart were reviewed this encounter and updated as appropriate:       Review of Systems:  No other skin or systemic complaints except as noted in HPI or Assessment and Plan.  Objective  Well appearing patient in no apparent distress; mood and affect are within normal limits.  A focused examination was performed including back, R axilla, buttocks. Relevant physical exam findings are noted in the Assessment and Plan.  Objective  Left spinal lower back: Pink bx site 1.0 x 0.9cm  Objective  Right medial gluteal cleft: Firm white SQ nodule with punctum 0.7cm  Objective  Right Axilla: Stuck-on, waxy, tan-brown papules -- Discussed benign etiology and prognosis.    Assessment & Plan  Basal cell carcinoma (BCC) of skin of other part of torso Left spinal lower back  Skin excision  Lesion length (cm):  1 Lesion width (cm):  0.9 Margin per side (cm):  0.2 Total excision diameter (cm):  1.4 Informed consent: discussed and consent obtained   Timeout: patient name, date of birth, surgical site, and procedure verified   Procedure prep:  Patient was prepped and draped in usual sterile fashion Prep type:  Povidone-iodine Anesthesia: the lesion was anesthetized in a standard fashion   Anesthetic:  1% lidocaine w/ epinephrine 1-100,000 buffered w/ 8.4% NaHCO3 (12cc) Instrument used: #15 blade   Hemostasis achieved with: pressure   Hemostasis achieved with comment:  Electrocautery Outcome: patient tolerated procedure well with no complications   Additional details:  3 o'clock medial tag   Skin repair Complexity:  Intermediate Final length (cm):  3 Undermining: edges undermined   Subcutaneous  layers (deep stitches):  Suture size:  3-0 Suture type: Vicryl (polyglactin 910)   Stitches:  Buried vertical mattress Fine/surface layer approximation (top stitches):  Suture size:  3-0 Suture type: nylon   Stitches: simple interrupted   Suture removal (days):  7 Hemostasis achieved with: suture Outcome: patient tolerated procedure well with no complications   Post-procedure details: sterile dressing applied and wound care instructions given   Dressing type: pressure dressing and bacitracin (Mupirocin)    Specimen 1 - Surgical pathology Differential Diagnosis: C44.519 BCC bx proven Check Margins: yes; 3 o'clock medial tag  Pink bx site 1.0 x 0.9cm [id number]  Recurrent bx proven  Epidermal cyst Right medial gluteal cleft  Benign-appearing. Exam most consistent with an epidermal inclusion cyst. Discussed that a cyst is a benign growth that can grow over time and sometimes get irritated or inflamed. Recommend observation if it is not bothersome. Discussed option of surgical excision to remove it if it is growing, symptomatic, or other changes noted. Please call for new or changing lesions so they can be evaluated.    Seborrheic keratosis Right Axilla  Reassured benign age-related growth.  Recommend observation.  Discussed cryotherapy if spot(s) become irritated or inflamed.   Return in about 1 week (around 10/20/2020) for suture removal.  I, Sonya Hupman, RMA, am acting as scribe for [name], MD . Documentation: I have reviewed the above documentation for accuracy and completeness, and I agree with the above.  [name]

## 2020-10-14 ENCOUNTER — Telehealth: Payer: Self-pay

## 2020-10-14 NOTE — Telephone Encounter (Signed)
Pt doing fine after yesterdays surgery./sh 

## 2020-10-15 ENCOUNTER — Ambulatory Visit: Payer: Medicare PPO | Admitting: Internal Medicine

## 2020-10-20 ENCOUNTER — Ambulatory Visit: Payer: Medicare PPO | Admitting: Dermatology

## 2020-10-21 ENCOUNTER — Encounter: Payer: Self-pay | Admitting: Dermatology

## 2020-10-21 ENCOUNTER — Other Ambulatory Visit: Payer: Self-pay

## 2020-10-21 ENCOUNTER — Ambulatory Visit (INDEPENDENT_AMBULATORY_CARE_PROVIDER_SITE_OTHER): Payer: Medicare PPO | Admitting: Dermatology

## 2020-10-21 DIAGNOSIS — C44519 Basal cell carcinoma of skin of other part of trunk: Secondary | ICD-10-CM

## 2020-10-21 DIAGNOSIS — Z4802 Encounter for removal of sutures: Secondary | ICD-10-CM

## 2020-10-21 NOTE — Progress Notes (Signed)
   Follow-Up Visit   Subjective  Brenda Burns is a 65 y.o. female who presents for the following: Post-op (BCC, margins free of the left spinal lower back).  The following portions of the chart were reviewed this encounter and updated as appropriate:       Review of Systems:  No other skin or systemic complaints except as noted in HPI or Assessment and Plan.  Objective  Well appearing patient in no apparent distress; mood and affect are within normal limits.  A focused examination was performed including back. Relevant physical exam findings are noted in the Assessment and Plan.  Objective  L spinal lower back: Healing excision site.   Assessment & Plan  Basal cell carcinoma (BCC) of skin of other part of torso L spinal lower back  BCC, margins free.  Patient presents for suture removal. The wound is well healed without signs of infection.  The sutures are removed. Wound care and activity instructions given.    Return as scheduled.  IJamesetta Orleans, CMA, am acting as scribe for [name], MD .  Documentation: I have reviewed the above documentation for accuracy and completeness, and I agree with the above.  [name]

## 2020-11-24 ENCOUNTER — Ambulatory Visit: Payer: Medicare PPO | Admitting: Internal Medicine

## 2021-02-09 ENCOUNTER — Ambulatory Visit: Payer: Medicare PPO | Admitting: Internal Medicine

## 2021-04-10 ENCOUNTER — Ambulatory Visit: Payer: Medicare PPO | Admitting: Internal Medicine

## 2021-04-23 ENCOUNTER — Other Ambulatory Visit: Payer: Self-pay

## 2021-04-23 ENCOUNTER — Ambulatory Visit: Payer: Medicare PPO | Admitting: Internal Medicine

## 2021-04-23 VITALS — BP 122/70 | HR 59 | Temp 96.8°F | Resp 16 | Ht 64.0 in | Wt 167.2 lb

## 2021-04-23 DIAGNOSIS — D696 Thrombocytopenia, unspecified: Secondary | ICD-10-CM

## 2021-04-23 DIAGNOSIS — E7800 Pure hypercholesterolemia, unspecified: Secondary | ICD-10-CM

## 2021-04-23 DIAGNOSIS — R7989 Other specified abnormal findings of blood chemistry: Secondary | ICD-10-CM

## 2021-04-23 DIAGNOSIS — F439 Reaction to severe stress, unspecified: Secondary | ICD-10-CM

## 2021-04-23 DIAGNOSIS — Z800 Family history of malignant neoplasm of digestive organs: Secondary | ICD-10-CM

## 2021-04-23 DIAGNOSIS — J411 Mucopurulent chronic bronchitis: Secondary | ICD-10-CM

## 2021-04-23 DIAGNOSIS — R945 Abnormal results of liver function studies: Secondary | ICD-10-CM

## 2021-04-23 LAB — HEPATIC FUNCTION PANEL
ALT: 23 U/L (ref 0–35)
AST: 21 U/L (ref 0–37)
Albumin: 4.4 g/dL (ref 3.5–5.2)
Alkaline Phosphatase: 86 U/L (ref 39–117)
Bilirubin, Direct: 0.1 mg/dL (ref 0.0–0.3)
Total Bilirubin: 0.5 mg/dL (ref 0.2–1.2)
Total Protein: 6.7 g/dL (ref 6.0–8.3)

## 2021-04-23 LAB — CBC WITH DIFFERENTIAL/PLATELET
Basophils Absolute: 0 10*3/uL (ref 0.0–0.1)
Basophils Relative: 0.7 % (ref 0.0–3.0)
Eosinophils Absolute: 0.1 10*3/uL (ref 0.0–0.7)
Eosinophils Relative: 2.4 % (ref 0.0–5.0)
HCT: 41.1 % (ref 36.0–46.0)
Hemoglobin: 13.7 g/dL (ref 12.0–15.0)
Lymphocytes Relative: 31.4 % (ref 12.0–46.0)
Lymphs Abs: 1.1 10*3/uL (ref 0.7–4.0)
MCHC: 33.3 g/dL (ref 30.0–36.0)
MCV: 87.9 fl (ref 78.0–100.0)
Monocytes Absolute: 0.3 10*3/uL (ref 0.1–1.0)
Monocytes Relative: 8.2 % (ref 3.0–12.0)
Neutro Abs: 2 10*3/uL (ref 1.4–7.7)
Neutrophils Relative %: 57.3 % (ref 43.0–77.0)
Platelets: 196 10*3/uL (ref 150.0–400.0)
RBC: 4.67 Mil/uL (ref 3.87–5.11)
RDW: 13.8 % (ref 11.5–15.5)
WBC: 3.5 10*3/uL — ABNORMAL LOW (ref 4.0–10.5)

## 2021-04-23 LAB — BASIC METABOLIC PANEL
BUN: 15 mg/dL (ref 6–23)
CO2: 28 mEq/L (ref 19–32)
Calcium: 9.4 mg/dL (ref 8.4–10.5)
Chloride: 103 mEq/L (ref 96–112)
Creatinine, Ser: 0.81 mg/dL (ref 0.40–1.20)
GFR: 75.26 mL/min (ref >60.00)
Glucose, Bld: 87 mg/dL (ref 70–99)
Potassium: 4.2 mEq/L (ref 3.5–5.1)
Sodium: 140 mEq/L (ref 135–145)

## 2021-04-23 LAB — LIPID PANEL
Cholesterol: 227 mg/dL — ABNORMAL HIGH (ref 0–200)
HDL: 69.4 mg/dL (ref >39.00)
LDL Cholesterol: 144 mg/dL — ABNORMAL HIGH (ref 0–99)
NonHDL: 157.5
Total CHOL/HDL Ratio: 3
Triglycerides: 70 mg/dL (ref 0.0–149.0)
VLDL: 14 mg/dL (ref 0.0–40.0)

## 2021-04-23 LAB — TSH: TSH: 2.29 u[IU]/mL (ref 0.35–5.50)

## 2021-04-23 NOTE — Progress Notes (Signed)
[name], female   DOB: 12-Jul-1956, 65 y.o.   MRN: [id number]   Subjective:    [name], female    DOB: Dec 12, 1955, 65 y.o.   MRN: [id number]  HPI This visit occurred during the SARS-CoV-2 public health emergency.  Safety protocols were in place, including screening questions prior to the visit, additional usage of staff PPE, and extensive cleaning of exam room while observing appropriate contact time as indicated for disinfecting solutions.   Patient here for a scheduled follow up.  Here to follow up regarding her cholesterol.  Increased stress recently.  Mother and father have recently passed.  Her husband is doing better.  Overall she feels she is handling things relatively well.  Has good support. Does not feel needs any further intervention.  No chest pain reported.  Breathing stable.  No increased cough or congestion.  No increased abdominal pain reported.  No bowel issues reported.  No sick contacts.  No fever.  No nausea or vomiting.  Past Medical History:  Diagnosis Date   Arthritis    Asthma    Basal cell carcinoma 08/01/2017   Left spinal mid lower back. Nodular.   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 Excised 11/26/2009, margins free.   Past Surgical History:  Procedure Laterality Date   ABDOMINAL HYSTERECTOMY  2004   endometriosis   TONSILLECTOMY  1060's   Family History  Problem Relation Age of Onset   COPD Mother    Cancer Mother        breast and colon   Breast cancer Mother 74   Diabetes Paternal Grandfather    Social History   Socioeconomic History   Marital status: Married    Spouse name: Not on file   Number of children: Not on file   Years of education: Not on file   Highest education level: Not on file  Occupational History    Not on file  Tobacco Use   Smoking status: Never   Smokeless tobacco: Never  Substance and Sexual Activity   Alcohol use: Yes    Comment: glass of wine occasionally   Drug use: No   Sexual activity: Yes    Birth control/protection: None  Other Topics Concern   Not on file  Social History Narrative   Not on file   Social Determinants of Health   Financial Resource Strain: Not on file  Food Insecurity: Not on file  Transportation Needs: Not on file  Physical Activity: Not on file  Stress: Not on file  Social Connections: Not on file    Review of Systems  Constitutional:  Negative for appetite change and unexpected weight change.  HENT:  Negative for congestion and sinus pressure.   Respiratory:  Negative for cough, chest tightness and shortness of breath.   Cardiovascular:  Negative for chest pain, palpitations and leg swelling.  Gastrointestinal:  Negative for abdominal pain, diarrhea, nausea and vomiting.  Genitourinary:  Negative for difficulty urinating and dysuria.  Musculoskeletal:  Negative for joint swelling and myalgias.  Skin:  Negative for color change and rash.  Neurological:  Negative for dizziness, light-headedness and headaches.  Psychiatric/Behavioral:  Negative for agitation and dysphoric mood.       Objective:    Physical  Exam Vitals reviewed.  Constitutional:      General: She is not in acute distress.    Appearance: Normal appearance.  HENT:     Head: Normocephalic and atraumatic.     Right Ear: External ear normal.     Left Ear: External ear normal.  Eyes:     General: No scleral icterus.       Right eye: No discharge.        Left eye: No discharge.     Conjunctiva/sclera: Conjunctivae normal.  Neck:     Thyroid: No thyromegaly.  Cardiovascular:     Rate and Rhythm: Normal rate and regular rhythm.  Pulmonary:     Effort: No respiratory distress.     Breath sounds: Normal breath sounds. No wheezing.  Abdominal:     General: Bowel sounds  are normal.     Palpations: Abdomen is soft.     Tenderness: There is no abdominal tenderness.  Musculoskeletal:        General: No swelling or tenderness.     Cervical back: Neck supple. No tenderness.  Lymphadenopathy:     Cervical: No cervical adenopathy.  Skin:    Findings: No erythema or rash.  Neurological:     Mental Status: She is alert.  Psychiatric:        Mood and Affect: Mood normal.        Behavior: Behavior normal.    BP 122/70   Pulse (!) 59   Temp (!) 96.8 F (36 C)   Resp 16   Ht 5\' 4"  (1.626 m)   Wt 167 lb 3.2 oz (75.8 kg)   SpO2 98%   BMI 28.70 kg/m  Wt Readings from Last 3 Encounters:  04/23/21 167 lb 3.2 oz (75.8 kg)  08/16/19 164 lb 3.2 oz (74.5 kg)  06/04/19 162 lb (73.5 kg)    Outpatient Encounter Medications as of 04/23/2021  Medication Sig   Calcium Carbonate-Vitamin D (CALTRATE 600+D PO) Take by mouth.   Multiple Vitamins-Minerals (CENTRUM SILVER PO) Take by mouth.   pravastatin (PRAVACHOL) 20 MG tablet TAKE 1 TABLET BY MOUTH DAILY   [DISCONTINUED] fluticasone (FLONASE) 50 MCG/ACT nasal spray USE 2 SPRAYS IN EACH NOSTRIL DAILY   [DISCONTINUED] ondansetron (ZOFRAN-ODT) 4 MG disintegrating tablet Take 1 tablet (4 mg total) by mouth every 8 (eight) hours as needed for nausea or vomiting.   [DISCONTINUED] Red Yeast Rice Extract (RED YEAST RICE PO) Take by mouth.   Facility-Administered Encounter Medications as of 04/23/2021  Medication   albuterol (PROVENTIL) (2.5 MG/3ML) 0.083% nebulizer solution 2.5 mg     Lab Results  Component Value Date   WBC 3.5 (L) 04/23/2021   HGB 13.7 04/23/2021   HCT 41.1 04/23/2021   PLT 196.0 04/23/2021   GLUCOSE 87 04/23/2021   CHOL 227 (H) 04/23/2021   TRIG 70.0 04/23/2021   HDL 69.40 04/23/2021   LDLCALC 144 (H) 04/23/2021   ALT 23 04/23/2021   AST 21 04/23/2021   NA 140 04/23/2021   K 4.2 04/23/2021   CL 103 04/23/2021   CREATININE 0.81 04/23/2021   BUN 15 04/23/2021   CO2 28 04/23/2021   TSH 2.29  04/23/2021    MM 3D SCREEN BREAST BILATERAL  Result Date: 09/22/2020 CLINICAL DATA:  Screening. EXAM: DIGITAL SCREENING BILATERAL MAMMOGRAM WITH TOMO AND CAD COMPARISON:  Previous exam(s). ACR Breast Density Category b: There are scattered areas of fibroglandular density. FINDINGS: There are no findings suspicious for malignancy. Images were processed with CAD.  IMPRESSION: No mammographic evidence of malignancy. A result letter of this screening mammogram will be mailed directly to the patient. RECOMMENDATION: Screening mammogram in one year. (Code:SM-B-01Y) BI-RADS CATEGORY  1: Negative. Electronically Signed   By: Lajean Manes M.D.   On: 09/22/2020 13:48       Assessment & Plan:   Problem List Items Addressed This Visit     Abnormal liver function tests    Have discussed diet and exercise.  Follow liver function tests.        Bronchitis, mucopurulent recurrent ([place])    Has a history of recurrent bronchitis.  Breathing stable.  No acute issues currently.        Family history of colon cancer    Saw GI 01/2020.  Recommended f/u colonoscopy.  She had to cancel due to family medical issues.  Ready for f/u colonoscopy.  Refer back to GI.         Hypercholesterolemia - Primary    Had intolerance to multiple statin medications.  Low cholesterol diet and exercise.  Follow lipid panel.        Relevant Orders   Basic metabolic panel (Completed)   Hepatic function panel (Completed)   Lipid panel (Completed)   TSH (Completed)   Stress    Increased stress as outlined.  Discussed.  Desires no further intervention at this point.  Follow.        Thrombocytopenia, unspecified ([place])   Relevant Orders   CBC with Differential/Platelet (Completed)     Einar Pheasant, MD

## 2021-04-28 ENCOUNTER — Encounter: Payer: Self-pay | Admitting: Internal Medicine

## 2021-04-28 DIAGNOSIS — F439 Reaction to severe stress, unspecified: Secondary | ICD-10-CM | POA: Insufficient documentation

## 2021-04-28 DIAGNOSIS — Z800 Family history of malignant neoplasm of digestive organs: Secondary | ICD-10-CM | POA: Insufficient documentation

## 2021-04-28 NOTE — Assessment & Plan Note (Signed)
Had intolerance to multiple statin medications.  Low cholesterol diet and exercise.  Follow lipid panel.

## 2021-04-28 NOTE — Assessment & Plan Note (Signed)
Has a history of recurrent bronchitis.  Breathing stable.  No acute issues currently.

## 2021-04-28 NOTE — Assessment & Plan Note (Signed)
Saw GI 01/2020.  Recommended f/u colonoscopy.  She had to cancel due to family medical issues.  Ready for f/u colonoscopy.  Refer back to GI.

## 2021-04-28 NOTE — Assessment & Plan Note (Signed)
Increased stress as outlined.  Discussed.  Desires no further intervention at this point.  Follow.

## 2021-04-28 NOTE — Assessment & Plan Note (Signed)
Have discussed diet and exercise. Follow liver function tests.   

## 2021-05-14 ENCOUNTER — Other Ambulatory Visit: Payer: Self-pay | Admitting: Internal Medicine

## 2021-07-14 DIAGNOSIS — Z800 Family history of malignant neoplasm of digestive organs: Secondary | ICD-10-CM | POA: Diagnosis not present

## 2021-07-14 DIAGNOSIS — K648 Other hemorrhoids: Secondary | ICD-10-CM | POA: Diagnosis not present

## 2021-07-30 ENCOUNTER — Encounter: Payer: Medicare PPO | Admitting: Internal Medicine

## 2021-08-06 ENCOUNTER — Encounter: Payer: Medicare PPO | Admitting: Internal Medicine

## 2021-08-17 ENCOUNTER — Other Ambulatory Visit: Payer: Self-pay | Admitting: Internal Medicine

## 2021-08-18 ENCOUNTER — Other Ambulatory Visit: Payer: Self-pay | Admitting: Internal Medicine

## 2021-08-18 ENCOUNTER — Telehealth (INDEPENDENT_AMBULATORY_CARE_PROVIDER_SITE_OTHER): Payer: Medicare PPO | Admitting: Internal Medicine

## 2021-08-18 ENCOUNTER — Telehealth: Payer: Self-pay | Admitting: Internal Medicine

## 2021-08-18 DIAGNOSIS — Z1231 Encounter for screening mammogram for malignant neoplasm of breast: Secondary | ICD-10-CM

## 2021-08-18 DIAGNOSIS — R059 Cough, unspecified: Secondary | ICD-10-CM | POA: Insufficient documentation

## 2021-08-18 DIAGNOSIS — R051 Acute cough: Secondary | ICD-10-CM | POA: Diagnosis not present

## 2021-08-18 DIAGNOSIS — J411 Mucopurulent chronic bronchitis: Secondary | ICD-10-CM | POA: Diagnosis not present

## 2021-08-18 MED ORDER — PREDNISONE 10 MG PO TABS: ORAL_TABLET | ORAL | 0 refills | Status: DC

## 2021-08-18 MED ORDER — ALBUTEROL SULFATE HFA 108 (90 BASE) MCG/ACT IN AERS: 2.0000 | INHALATION_SPRAY | Freq: Four times a day (QID) | RESPIRATORY_TRACT | 0 refills | Status: DC | PRN

## 2021-08-18 MED ORDER — DOXYCYCLINE HYCLATE 100 MG PO TABS: 100.0000 mg | ORAL_TABLET | Freq: Two times a day (BID) | ORAL | 0 refills | Status: DC

## 2021-08-18 NOTE — Telephone Encounter (Signed)
Pt called in requesting medication (doxycycline) and/or (levaquin). Pt stated that she is not feeling well.

## 2021-08-18 NOTE — Telephone Encounter (Signed)
FYI-  Spoke with patient. Sx started 11/12. Home covid test neg 11/13. Cough- productive, green/yellow mucus, congestion, sore/scratchy throat. Denies fever, chills, sob, vomiting, nausea, chest tightness, boday aches, etc. Patient did not want to do visit- stated she was instructed to call when she needs something. She has these symptoms about 1-2 times per year and has rx for prednisone at home but does not have abx. Advised that given her symptoms and protocol at office about prescribing abx, it would be better to set up visit to determine best treatment options. Called patient back and was added to your schedule today at 2:00.

## 2021-08-18 NOTE — Progress Notes (Signed)
[name], female   DOB: 25-Mar-1956, 65 y.o.   MRN: [id number]   Virtual Visit via video Note  This visit type was conducted due to national recommendations for restrictions regarding the COVID-19 pandemic (e.g. social distancing).  This format is felt to be most appropriate for this patient at this time.  All issues noted in this document were discussed and addressed.  No physical exam was performed (except for noted visual exam findings with Video Visits).   I connected with Brenda Burns by a video enabled telemedicine application and verified that I am speaking with the correct person using two identifiers. Location patient: home Location provider: work  Persons participating in the virtual visit: patient, provider  The limitations, risks, security and privacy concerns of performing an evaluation and management service by video and the availability of in person appointments have been discussed.  It has also been discussed with the patient that there may be a patient responsible charge related to this service. The patient expressed understanding and agreed to proceed.   Reason for visit: work in appt  HPI: Work in with concerns regarding increased cough and congestion.  Developed sore throat 08/15/21.  08/16/21 - covid negative.  Started - head congestion.  Sore throat.  Throat better now.  Increased cough - productive of green mucus.  No fever.  No sinus congestion or pressure.  No nasal congestion.  Some wheezing.  No nausea or vomiting.  No diarrhea.  No sob or chest pain.  Some chest tightness.  Increased cough - coughing fits.  Taking tylenol and mucinex DM.  Has a history of bronchitis.  Feels similar to previous episodes.     ROS: See pertinent positives and negatives per HPI.  Past Medical History:  Diagnosis Date   Arthritis    Asthma    Basal cell carcinoma 08/01/2017   Left spinal mid lower back. Nodular.   Basal cell carcinoma 08/19/2020   L spinal lower  back, exc 10/13/20   Chronic sinusitis with recurrent bronchitis    Hx of basal cell carcinoma 11/04/2015   Right medial shoulder   Hx of melanoma in situ 11/14/2009   L buttock. Excised 11/26/2009, margins free.    Past Surgical History:  Procedure Laterality Date   ABDOMINAL HYSTERECTOMY  2004   endometriosis   TONSILLECTOMY  1060's    Family History  Problem Relation Age of Onset   COPD Mother    Cancer Mother        breast and colon   Breast cancer Mother 42   Diabetes Paternal Grandfather     SOCIAL HX: reviewed.    Current Outpatient Medications:    albuterol (VENTOLIN HFA) 108 (90 Base) MCG/ACT inhaler, Inhale 2 puffs into the lungs every 6 (six) hours as needed for wheezing or shortness of breath., Disp: 18 g, Rfl: 0   doxycycline (VIBRA-TABS) 100 MG tablet, Take 1 tablet (100 mg total) by mouth 2 (two) times daily., Disp: 20 tablet, Rfl: 0   predniSONE (DELTASONE) 10 MG tablet, Take 6 tablets x 1 day and then decrease by 1/2 tablet per day until down to zero mg., Disp: 39 tablet, Rfl: 0   Calcium Carbonate-Vitamin D (CALTRATE 600+D PO), Take by mouth., Disp: , Rfl:    Multiple Vitamins-Minerals (CENTRUM SILVER PO), Take by mouth., Disp: , Rfl:    pravastatin (PRAVACHOL) 20 MG tablet, TAKE 1 TABLET BY MOUTH DAILY, Disp: 90 tablet, Rfl: 1  Current Facility-Administered Medications:  albuterol (PROVENTIL) (2.5 MG/3ML) 0.083% nebulizer solution 2.5 mg, 2.5 mg, Nebulization, Once, Derrel Nip, Aris Everts, MD  EXAM:  GENERAL: alert, oriented, appears well and in no acute distress  HEENT: atraumatic, conjunttiva clear, no obvious abnormalities on inspection of external nose and ears  NECK: normal movements of the head and neck  LUNGS: on inspection no signs of respiratory distress, breathing rate appears normal, no obvious gross SOB, gasping or wheezing  CV: no obvious cyanosis  PSYCH/NEURO: pleasant and cooperative, no obvious depression or anxiety, speech and thought  processing grossly intact  ASSESSMENT AND PLAN:  Discussed the following assessment and plan:  Problem List Items Addressed This Visit     Bronchitis, mucopurulent recurrent ([place])    Has a history of recurrent bronchitis.  Increased cough and congestion as outlined.  Symptoms as outlined.  Treat with mucinex/robitussin DM as directed.  Prednisone taper as directed.  Albuterol prn.  Doxycycline.  Probiotics as directed.  Swab for covid.  Discussed quarantine guidelines.  Follow.  Call with update.       Cough    Cough and congestion as outlined.  Treat with abx, prednisone taper, mucinex and albuterol as directed.  Follow.  Call with update.       Relevant Orders   COVID-19, Flu A+B and RSV (Completed)    Return if symptoms worsen or fail to improve.   I discussed the assessment and treatment plan with the patient. The patient was provided an opportunity to ask questions and all were answered. The patient agreed with the plan and demonstrated an understanding of the instructions.   The patient was advised to call back or seek an in-person evaluation if the symptoms worsen or if the condition fails to improve as anticipated.    Einar Pheasant, MD

## 2021-08-19 LAB — COVID-19, FLU A+B AND RSV
Influenza A, NAA: NOT DETECTED
Influenza B, NAA: NOT DETECTED
RSV, NAA: NOT DETECTED
SARS-CoV-2, NAA: NOT DETECTED

## 2021-08-22 ENCOUNTER — Encounter: Payer: Self-pay | Admitting: Internal Medicine

## 2021-08-22 NOTE — Assessment & Plan Note (Signed)
Has a history of recurrent bronchitis.  Increased cough and congestion as outlined.  Symptoms as outlined.  Treat with mucinex/robitussin DM as directed.  Prednisone taper as directed.  Albuterol prn.  Doxycycline.  Probiotics as directed.  Swab for covid.  Discussed quarantine guidelines.  Follow.  Call with update.

## 2021-08-22 NOTE — Assessment & Plan Note (Signed)
Cough and congestion as outlined.  Treat with abx, prednisone taper, mucinex and albuterol as directed.  Follow.  Call with update.

## 2021-09-01 ENCOUNTER — Ambulatory Visit: Payer: Medicare PPO | Admitting: Dermatology

## 2021-09-01 ENCOUNTER — Other Ambulatory Visit: Payer: Self-pay

## 2021-09-01 DIAGNOSIS — I781 Nevus, non-neoplastic: Secondary | ICD-10-CM

## 2021-09-01 DIAGNOSIS — L578 Other skin changes due to chronic exposure to nonionizing radiation: Secondary | ICD-10-CM

## 2021-09-01 DIAGNOSIS — Z1283 Encounter for screening for malignant neoplasm of skin: Secondary | ICD-10-CM | POA: Diagnosis not present

## 2021-09-01 DIAGNOSIS — L720 Epidermal cyst: Secondary | ICD-10-CM

## 2021-09-01 DIAGNOSIS — D2330 Other benign neoplasm of skin of unspecified part of face: Secondary | ICD-10-CM | POA: Diagnosis not present

## 2021-09-01 DIAGNOSIS — D229 Melanocytic nevi, unspecified: Secondary | ICD-10-CM

## 2021-09-01 DIAGNOSIS — Z85828 Personal history of other malignant neoplasm of skin: Secondary | ICD-10-CM

## 2021-09-01 DIAGNOSIS — Z86006 Personal history of melanoma in-situ: Secondary | ICD-10-CM | POA: Diagnosis not present

## 2021-09-01 DIAGNOSIS — D1801 Hemangioma of skin and subcutaneous tissue: Secondary | ICD-10-CM

## 2021-09-01 DIAGNOSIS — L814 Other melanin hyperpigmentation: Secondary | ICD-10-CM | POA: Diagnosis not present

## 2021-09-01 DIAGNOSIS — D2371 Other benign neoplasm of skin of right lower limb, including hip: Secondary | ICD-10-CM | POA: Diagnosis not present

## 2021-09-01 DIAGNOSIS — L821 Other seborrheic keratosis: Secondary | ICD-10-CM

## 2021-09-01 NOTE — Progress Notes (Signed)
Follow-Up Visit   Subjective  Brenda Burns is a 65 y.o. female who presents for the following: Follow-up (Patient here today for full body exam. Reports a tumor like growth at  buttocks she would like to discuss removal. ).  She also has a bump on her face that is always sore and irritated.  She has h/o BCC.  Patient here for full body skin exam and skin cancer screening.   The following portions of the chart were reviewed this encounter and updated as appropriate:      Review of Systems: No other skin or systemic complaints except as noted in HPI or Assessment and Plan.   Objective  Well appearing patient in no apparent distress; mood and affect are within normal limits.  A full examination was performed including scalp, head, eyes, ears, nose, lips, neck, chest, axillae, abdomen, back, buttocks, bilateral upper extremities, bilateral lower extremities, hands, feet, fingers, toes, fingernails, and toenails. All findings within normal limits unless otherwise noted below.  right medial gluteal cleft 6 mm subcutaneous nodule with punctum   right medial cheek Pink yellow/white firm papule on right medial cheek   Assessment & Plan  Epidermal inclusion cyst right medial gluteal cleft  Cyst with symptoms and/or recent change.  Discussed surgical excision to remove, including resulting scar and possible recurrence.  Patient will schedule for surgery. Pre-op information given.   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 - Call for any changes  Seborrheic Keratoses - Stuck-on, waxy, tan-brown papules and/or plaques  - Benign-appearing - Discussed benign etiology and prognosis. - Observe - Call for any changes  Melanocytic Nevi - Tan-brown and/or pink-flesh-colored symmetric macules and papules - Benign appearing on exam today - Observation - Call clinic for new or changing moles - Recommend daily use of broad spectrum spf 30+ sunscreen to sun-exposed areas.   Dermatofibroma - Firm pink/brown papulenodule with dimple sign left pretibial , right calf  - Benign appearing - Call for any changes  Varicose Veins/Spider Veins - Dilated blue, purple or red veins at the lower extremities - Reassured - Smaller vessels can be treated by sclerotherapy (a procedure to inject a medicine into the veins to make them disappear) if desired, but the treatment is not covered by insurance. Larger vessels may be covered if symptomatic and we would refer to vascular surgeon if treatment desired.  Hemangiomas - Red papules - Discussed benign nature - Observe - Call for any changes  Actinic Damage - Chronic condition, secondary to cumulative UV/sun exposure - diffuse scaly erythematous macules with underlying dyspigmentation - Recommend daily broad spectrum sunscreen SPF 30+ to sun-exposed areas, reapply every 2 hours as needed.  - Staying in the shade or wearing long sleeves, sun glasses (UVA+UVB protection) and wide brim hats (4-inch brim around the entire circumference of the hat) are also recommended for sun protection.  - Call for new or changing lesions.  History of Basal Cell Carcinoma of  the Skin - No evidence of recurrence today at multiple locations see history, L spinal lower back is clear. - Recommend regular full body skin exams - Recommend daily broad spectrum sunscreen  SPF 30+ to sun-exposed areas, reapply every 2 hours as needed.  - Call if any new or changing lesions are noted between office visits  History of Melanoma in Situ - No evidence of recurrence today at left buttock (2011) - Recommend regular full body skin exams - Recommend daily broad spectrum sunscreen SPF 30+ to sun-exposed areas, reapply every 2 hours as needed.  - Call if any new or changing lesions are noted between office visits  Skin cancer screening performed today.  Return for surgery appointment for cyst , 1 year tbse . I, Ruthell Rummage, CMA, am acting as scribe for [name], MD.  Documentation: I have reviewed the above documentation for accuracy and completeness, and I agree with the above.  [name]

## 2021-09-01 NOTE — Patient Instructions (Addendum)
Pre-Operative Instructions  You are scheduled for a surgical procedure at [facility]. We recommend you read the following instructions. If you have any questions or concerns, please call the office at [phone].  Shower and wash the entire body with soap and water the day of your surgery paying special attention to cleansing at and around the planned surgery site.  Avoid aspirin or aspirin containing products at least fourteen (14) days prior to your surgical procedure and for at least one week (7 Days) after your surgical procedure. If you take aspirin on a regular basis for heart disease or history of stroke or for any other reason, we may recommend you continue taking aspirin but please notify us if you take this on a regular basis. Aspirin can cause more bleeding to occur during surgery as well as prolonged bleeding and bruising after surgery.   Avoid other nonsteroidal pain medications at least one week prior to surgery and at least one week prior to your surgery. These include medications such as Ibuprofen (Motrin, Advil and Nuprin), Naprosyn, Voltaren, Relafen, etc. If medications are used for therapeutic reasons, please inform us as they can cause increased bleeding or prolonged bleeding during and bruising after surgical procedures.   Please advise [place] if you are taking any "blood thinner" medications such as Coumadin or Dipyridamole or Plavix or similar medications. These cause increased bleeding and prolonged bleeding during procedures and bruising after surgical procedures. We may have to consider discontinuing these medications briefly prior to and shortly after your surgery if safe to do so.   Please inform us of all medications you are currently taking. All medications that are taken regularly should be taken the day of surgery as you always do. Nevertheless, we need to be informed of what medications you are taking prior to surgery to know whether they will affect the  procedure or cause any complications.   Please inform us of any medication allergies. Also inform us of whether you have allergies to Latex or rubber products or whether you have had any adverse reaction to Lidocaine or Epinephrine.  Please inform us of any prosthetic or artificial body parts such as artificial heart valve, joint replacements, etc., or similar condition that might require preoperative antibiotics.   We recommend avoidance of alcohol at least two weeks prior to surgery and continued avoidance for at least two weeks after surgery.   We recommend discontinuation of tobacco smoking at least two weeks prior to surgery and continued abstinence for at least two weeks after surgery.  Do not plan strenuous exercise, strenuous work or strenuous lifting for approximately four weeks after your surgery.   We request if you are unable to make your scheduled surgical appointment, please call us at least a week in advance or as soon as you are aware of a problem so that we can cancel or reschedule the appointment.   You MAY TAKE TYLENOL (acetaminophen) for pain as it is not a blood thinner.   PLEASE PLAN TO BE IN TOWN FOR TWO WEEKS FOLLOWING SURGERY, THIS IS IMPORTANT SO YOU CAN BE CHECKED FOR DRESSING CHANGES, SUTURE REMOVAL AND TO MONITOR FOR POSSIBLE COMPLICATIONS.      Melanoma ABCDEs  Melanoma is the most dangerous type of skin cancer, and is the leading cause of death from skin disease.  You are more likely to develop melanoma if you: Have light-colored skin, light-colored eyes, or red or blond hair Spend a lot of time in the sun Tan  regularly, either outdoors or in a tanning bed Have had blistering sunburns, especially during childhood Have a close family member who has had a melanoma Have atypical moles or large birthmarks  Early detection of melanoma is key since treatment is typically straightforward and cure rates are extremely high if we catch it early.   The first sign  of melanoma is often a change in a mole or a new dark spot.  The ABCDE system is a way of remembering the signs of melanoma.  A for asymmetry:  The two halves do not match. B for border:  The edges of the growth are irregular. C for color:  A mixture of colors are present instead of an even brown color. D for diameter:  Melanomas are usually (but not always) greater than 38mm - the size of a pencil eraser. E for evolution:  The spot keeps changing in size, shape, and color.  Please check your skin once per month between visits. You can use a small mirror in front and a large mirror behind you to keep an eye on the back side or your body.   If you see any new or changing lesions before your next follow-up, please call to schedule a visit.  Please continue daily skin protection including broad spectrum sunscreen SPF 30+ to sun-exposed areas, reapplying every 2 hours as needed when you're outdoors.   Staying in the shade or wearing long sleeves, sun glasses (UVA+UVB protection) and wide brim hats (4-inch brim around the entire circumference of the hat) are also recommended for sun protection.    If You Need Anything After Your Visit  If you have any questions or concerns for your doctor, please call our main line at [phone] and press option 4 to reach your doctor's medical assistant. If no one answers, please leave a voicemail as directed and we will return your call as soon as possible. Messages left after 4 pm will be answered the following business day.   You may also send [place] a message via [place]. We typically respond to MyChart messages within 1-2 business days.  For prescription refills, please ask your pharmacy to contact our office. Our fax number is [phone].  If you have an urgent issue when the clinic is closed that cannot wait until the next business day, you can page your doctor at the number below.    Please note that while we do our best to be available for urgent issues  outside of office hours, we are not available 24/7.   If you have an urgent issue and are unable to reach [place], you may choose to seek medical care at your doctor's office, retail clinic, urgent care center, or emergency room.  If you have a medical emergency, please immediately call 911 or go to the emergency department.  Pager Numbers  - Dr. Nehemiah Massed: [phone]  - Dr. Laurence Ferrari: [phone]  - Dr. Nicole Kindred: [phone]  In the event of inclement weather, please call our main line at [phone] for an update on the status of any delays or closures.  Dermatology Medication Tips: Please keep the boxes that topical medications come in in order to help keep track of the instructions about where and how to use these. Pharmacies typically print the medication instructions only on the boxes and not directly on the medication tubes.   If your medication is too expensive, please contact our office at [phone] option 4 or send [place] a message through [place].   We are  unable to tell what your co-pay for medications will be in advance as this is different depending on your insurance coverage. However, we may be able to find a substitute medication at lower cost or fill out paperwork to get insurance to cover a needed medication.   If a prior authorization is required to get your medication covered by your insurance company, please allow [place] 1-2 business days to complete this process.  Drug prices often vary depending on where the prescription is filled and some pharmacies may offer cheaper prices.  The website www.goodrx.com contains coupons for medications through different pharmacies. The prices here do not account for what the cost may be with help from insurance (it may be cheaper with your insurance), but the website can give you the price if you did not use any insurance.  - You can print the associated coupon and take it with your prescription to the pharmacy.  - You may also stop by our  office during regular business hours and pick up a GoodRx coupon card.  - If you need your prescription sent electronically to a different pharmacy, notify our office through [facility] or by phone at [phone] option 4.     Si Usted Necesita Algo Despus de Su Visita  Tambin puede enviarnos un mensaje a travs de Pharmacist, community. Por lo general respondemos a los mensajes de MyChart en el transcurso de 1 a 2 das hbiles.  Para renovar recetas, por favor pida a su farmacia que se ponga en contacto con nuestra oficina. Harland Dingwall de fax es [place] [phone].  Si tiene un asunto urgente cuando la clnica est cerrada y que no puede esperar hasta el siguiente da hbil, puede llamar/localizar a su doctor(a) al nmero que aparece a continuacin.   Por favor, tenga en cuenta que aunque hacemos todo lo posible para estar disponibles para asuntos urgentes fuera del horario de [place], no estamos disponibles las 24 horas del da, los 7 das de la [place].   Si tiene un problema urgente y no puede comunicarse con nosotros, puede optar por buscar atencin mdica  en el consultorio de su doctor(a), en una clnica privada, en un centro de atencin urgente o en una sala de emergencias.  Si tiene Engineering geologist, por favor llame inmediatamente al 911 o vaya a la sala de emergencias.  Nmeros de bper  - Dr. Nehemiah Massed: [phone]  - Dra. Moye: [phone]  - Dra. Nicole Kindred: [phone]  En caso de inclemencias del [place], por favor llame a Johnsie Kindred principal al [phone] para una actualizacin sobre el [place] de cualquier retraso o cierre.  Consejos para la medicacin en dermatologa: Por favor, guarde las cajas en las que vienen los medicamentos de uso tpico para ayudarle a seguir las instrucciones sobre dnde y cmo usarlos. Las farmacias generalmente imprimen las instrucciones del medicamento slo en las cajas y no directamente en los tubos del [place].   Si su  medicamento es muy caro, por favor, pngase en contacto con Zigmund Daniel llamando al [phone] y presione la opcin 4 o envenos un mensaje a travs de Pharmacist, community.   No podemos decirle cul ser su copago por los medicamentos por adelantado ya que esto es diferente dependiendo de la cobertura de su seguro. Sin embargo, es posible que podamos encontrar un medicamento sustituto a Electrical engineer un formulario para que el seguro cubra el medicamento que se considera necesario.   Si se requiere una autorizacin previa para que su compaa de  seguros [place] su medicamento, por favor permtanos de 1 a 2 das hbiles para completar este proceso.  Los precios de los medicamentos varan con frecuencia dependiendo del Environmental consultant de dnde se surte la receta y alguna farmacias pueden ofrecer precios ms baratos.  El sitio web www.goodrx.com tiene cupones para medicamentos de Airline pilot. Los precios aqu no tienen en cuenta lo que podra costar con la ayuda del seguro (puede ser ms barato con su seguro), pero el sitio web puede darle el precio si no utiliz Research scientist (physical sciences).  - Puede imprimir el cupn correspondiente y llevarlo con su receta a la farmacia.  - Tambin puede pasar por nuestra oficina durante el horario de atencin regular y Charity fundraiser una tarjeta de cupones de GoodRx.  - Si necesita que su receta se enve electrnicamente a una farmacia diferente, informe a nuestra oficina a travs de MyChart de [place] o por telfono llamando al [phone] y presione la opcin 4.

## 2021-09-10 ENCOUNTER — Encounter: Payer: Medicare PPO | Admitting: Internal Medicine

## 2021-09-16 ENCOUNTER — Encounter: Payer: Self-pay | Admitting: Internal Medicine

## 2021-09-16 DIAGNOSIS — Z1211 Encounter for screening for malignant neoplasm of colon: Secondary | ICD-10-CM | POA: Diagnosis not present

## 2021-09-16 DIAGNOSIS — K640 First degree hemorrhoids: Secondary | ICD-10-CM | POA: Diagnosis not present

## 2021-09-16 DIAGNOSIS — Z800 Family history of malignant neoplasm of digestive organs: Secondary | ICD-10-CM | POA: Diagnosis not present

## 2021-09-21 ENCOUNTER — Other Ambulatory Visit: Payer: Self-pay

## 2021-09-21 ENCOUNTER — Ambulatory Visit
Admission: RE | Admit: 2021-09-21 | Discharge: 2021-09-21 | Disposition: A | Discharge status: Home / Self Care | Payer: Medicare PPO | Source: Ambulatory Visit | Attending: Internal Medicine | Admitting: Internal Medicine

## 2021-09-21 DIAGNOSIS — Z1231 Encounter for screening mammogram for malignant neoplasm of breast: Secondary | ICD-10-CM | POA: Insufficient documentation

## 2021-10-16 ENCOUNTER — Encounter: Payer: Medicare PPO | Admitting: Internal Medicine

## 2021-11-09 ENCOUNTER — Encounter: Payer: Medicare PPO | Admitting: Dermatology

## 2021-11-12 ENCOUNTER — Encounter: Payer: Self-pay | Admitting: Internal Medicine

## 2021-11-13 NOTE — Telephone Encounter (Signed)
Pt called in stating that she tested positive for Covid today. Pt stated that she she been sick ever since Monday. Pt stated that she lost her test and smell yesterday. Pt stated that she sent you a mychart message. No available appt. Below is a copy of mychart message... Pt requesting callback.    "Hi Brenda Burns I just tested positive for Covid tonight. Havent felt good all week. Do I need to come in or is there something that can be called in? Thanks, Brenda Burns"

## 2021-11-13 NOTE — Telephone Encounter (Signed)
Spoke with patient and offered her an appointment with NP. Pt declined because she is going to take her husband to the doctor who is also covid positive. Patient confirmed no sob, chest tightness, fever, chills, body aches, etc. She has mild headache and sinus symptoms. She is going to follow up next week if symptoms do not resolve. Her symptoms started 2/6

## 2021-12-03 ENCOUNTER — Encounter: Payer: Medicare PPO | Admitting: Internal Medicine

## 2021-12-10 ENCOUNTER — Ambulatory Visit: Payer: Medicare PPO

## 2021-12-10 ENCOUNTER — Other Ambulatory Visit: Payer: Self-pay

## 2021-12-10 DIAGNOSIS — Z13820 Encounter for screening for osteoporosis: Secondary | ICD-10-CM

## 2021-12-10 DIAGNOSIS — Z0000 Encounter for general adult medical examination without abnormal findings: Secondary | ICD-10-CM

## 2021-12-10 NOTE — Progress Notes (Unsigned)
Subjective:   Brenda Burns is a 66 y.o. female who presents for an Initial Medicare Annual Wellness Visit.  Review of Systems    Defer to Provider. ng       Objective:    There were no vitals filed for this visit. There is no height or weight on file to calculate BMI.  Advanced Directives 06/04/2019  Does Patient Have a Medical Advance Directive? No    Current Medications (verified) Outpatient Encounter Medications as of 12/10/2021  Medication Sig   albuterol (VENTOLIN HFA) 108 (90 Base) MCG/ACT inhaler Inhale 2 puffs into the lungs every 6 (six) hours as needed for wheezing or shortness of breath.   Calcium Carbonate-Vitamin D (CALTRATE 600+D PO) Take by mouth.   doxycycline (VIBRA-TABS) 100 MG tablet Take 1 tablet (100 mg total) by mouth 2 (two) times daily.   Multiple Vitamins-Minerals (CENTRUM SILVER PO) Take by mouth.   pravastatin (PRAVACHOL) 20 MG tablet TAKE 1 TABLET BY MOUTH DAILY   predniSONE (DELTASONE) 10 MG tablet Take 6 tablets x 1 day and then decrease by 1/2 tablet per day until down to zero mg.   Facility-Administered Encounter Medications as of 12/10/2021  Medication   albuterol (PROVENTIL) (2.5 MG/3ML) 0.083% nebulizer solution 2.5 mg    Allergies (verified) Penicillins   History: Past Medical History:  Diagnosis Date   Arthritis    Asthma    Basal cell carcinoma 08/01/2017   Left spinal mid lower back. Nodular.   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 Excised 11/26/2009, margins free.   Past Surgical History:  Procedure Laterality Date   ABDOMINAL HYSTERECTOMY  2004   endometriosis   TONSILLECTOMY  1060's   Family History  Problem Relation Age of Onset   COPD Mother    Cancer Mother        breast and colon   Breast cancer Mother 60   Diabetes  Paternal Grandfather    Social History   Socioeconomic History   Marital status: Married    Spouse name: Not on file   Number of children: Not on file   Years of education: Not on file   Highest education level: Not on file  Occupational History   Not on file  Tobacco Use   Smoking status: Never   Smokeless tobacco: Never  Substance and Sexual Activity   Alcohol use: Yes    Comment: glass of wine occasionally   Drug use: No   Sexual activity: Yes    Birth control/protection: None  Other Topics Concern   Not on file  Social History Narrative   Not on file   Social Determinants of Health   Financial Resource Strain: Not on file  Food Insecurity: Not on file  Transportation Needs: Not on file  Physical Activity: Not on file  Stress: Not on file  Social Connections: Not on file    Tobacco Counseling Counseling given: Not Answered   Clinical Intake:                 Diabetic  No          Activities of Daily Living No flowsheet data found.  Patient Care Team: Einar Pheasant, MD as PCP - General (Internal Medicine)  Indicate any recent Medical Services you  may have received from other than [facility] providers in the past year (date may be approximate).     Assessment:   This is a routine wellness examination for Brenda Burns.  Hearing/Vision screen No results found.  Dietary issues and exercise activities discussed:     Goals Addressed   None   Depression Screen PHQ 2/9 Scores 04/23/2021 07/13/2017  PHQ - 2 Score 0 0    Fall Risk Fall Risk  04/23/2021 07/13/2017  Falls in the past year? 0 No  Number falls in past yr: 0 -  Injury with Fall? 0 -    FALL RISK PREVENTION PERTAINING TO THE HOME:  Any stairs in or around the home? No  If so, are there any without handrails? Yes  Home free of loose throw rugs in walkways, pet beds, electrical cords, etc? Yes  Adequate lighting in your home to reduce risk of falls? Yes   ASSISTIVE DEVICES UTILIZED  TO PREVENT FALLS:  Life alert? No  Use of a cane, walker or w/c? No  Grab bars in the bathroom? Yes  Shower chair or bench in shower? Yes  Elevated toilet seat or a handicapped toilet? Yes   TIMED UP AND GO:  Was the test performed? No .  Length of time to ambulate 10 feet: N/a sec.     Cognitive Function:        Immunizations Immunization History  Administered Date(s) Administered   Fluad Quad(high Dose 65+) 08/16/2019   Influenza,inj,Quad PF,6+ Mos 06/13/2014, 10/07/2015, 07/13/2017   Influenza-Unspecified 07/14/2018, 08/26/2020, 08/04/2021   PFIZER(Purple Top)SARS-COV-2 Vaccination 11/10/2019, 12/04/2019   Pneumococcal Conjugate-13 12/27/2013   Pneumococcal Polysaccharide-23 10/26/2018   Zoster Recombinat (Shingrix) 10/21/2018, 03/05/2019    TDAP status: Due, Education has been provided regarding the importance of this vaccine. Advised may receive this vaccine at local pharmacy or Health Dept. Aware to provide a copy of the vaccination record if obtained from local pharmacy or Health Dept. Verbalized acceptance and understanding.  Flu Vaccine status: Up to date  Pneumococcal vaccine status: Up to date  Covid-19 vaccine status: Information provided on how to obtain vaccines.   Qualifies for Shingles Vaccine? Yes   Zostavax completed No   Shingrix Completed?: Yes  Screening Tests Health Maintenance  Topic Date Due   TETANUS/TDAP  Never done   DEXA SCAN  Never done   COVID-19 Vaccine (3 - Pfizer risk series) 01/01/2020   MAMMOGRAM  09/22/2023   Pneumonia Vaccine 62+ Years old (3) 10/27/2023   COLONOSCOPY (Pts 45-52yr Insurance coverage will need to be confirmed)  08/01/2024   INFLUENZA VACCINE  Completed   Hepatitis C Screening  Completed   Zoster Vaccines- Shingrix  Completed   HPV VACCINES  Aged Out    Health Maintenance  Health Maintenance Due  Topic Date Due   TETANUS/TDAP  Never done   DEXA SCAN  Never done   COVID-19 Vaccine (3 - Pfizer risk  series) 01/01/2020    Colorectal cancer screening: Type of screening: Colonoscopy. Completed 08/01/2014. Repeat every 10 years  Mammogram status: Completed 09/21/2021. Repeat every year  Bone Density status: Ordered yes. Pt provided with contact info and advised to call to schedule appt.  Lung Cancer Screening: (Low Dose CT Chest recommended if Age 68-80years, 30 pack-year currently smoking OR have quit w/in 15years.) does not qualify.   Lung Cancer Screening Referral: N/a  Additional Screening:  Hepatitis C Screening: does qualify; Completed 08/16/2019  Vision Screening: Recommended annual ophthalmology exams for early detection of  glaucoma and other disorders of the eye. Is the patient up to date with their annual eye exam?  No  Who is the provider or what is the name of the office in which the patient attends annual eye exams? N/a If pt is not established with a provider, would they like to be referred to a provider to establish care? No .   Dental Screening: Recommended annual dental exams for proper oral hygiene  Community Resource Referral / Chronic Care Management: CRR required this visit?  No   CCM required this visit?  No      Plan:     I have personally reviewed and noted the following in the patients chart:   Medical and social history Use of alcohol, tobacco or illicit drugs  Current medications and supplements including opioid prescriptions. Patient is not currently taking opioid prescriptions. Functional ability and status Nutritional status Physical activity Advanced directives List of other physicians Hospitalizations, surgeries, and ER visits in previous 12 months Vitals Screenings to include cognitive, depression, and falls Referrals and appointments  In addition, I have reviewed and discussed with patient certain preventive protocols, quality metrics, and best practice recommendations. A written personalized care plan for preventive services as  well as general preventive health recommendations were provided to patient.     Gordy Councilman, [place]   12/10/2021   Nurse Notes: Non-Face to Face 30 minute visit.   Brenda Burns , Thank you for taking time to come for your Medicare Wellness Visit. I appreciate your ongoing commitment to your health goals. Please review the following plan we discussed and let me know if I can assist you in the future.   These are the goals we discussed:  Goals   None     This is a list of the screening recommended for you and due dates:  Health Maintenance  Topic Date Due   Tetanus Vaccine  Never done   DEXA scan (bone density measurement)  Never done   COVID-19 Vaccine (3 - Pfizer risk series) 01/01/2020   Mammogram  09/22/2023   Pneumonia Vaccine (3) 10/27/2023   Colon Cancer Screening  08/01/2024   Flu Shot  Completed   Hepatitis C Screening: USPSTF Recommendation to screen - Ages 18-79 yo.  Completed   Zoster (Shingles) Vaccine  Completed   HPV Vaccine  Aged Out     6CIT Screen 12/10/2021  What Year? 0 points  What month? 0 points  What time? 0 points  Count back from 20 0 points  Months in reverse 0 points  Repeat phrase 0 points  Total Score 0

## 2021-12-28 ENCOUNTER — Ambulatory Visit: Payer: Medicare PPO | Admitting: Dermatology

## 2021-12-28 ENCOUNTER — Other Ambulatory Visit: Payer: Self-pay

## 2021-12-28 DIAGNOSIS — L720 Epidermal cyst: Secondary | ICD-10-CM

## 2021-12-28 DIAGNOSIS — D485 Neoplasm of uncertain behavior of skin: Secondary | ICD-10-CM

## 2021-12-28 MED ORDER — MUPIROCIN 2 % EX OINT: 1.0000 "application " | TOPICAL_OINTMENT | Freq: Every day | CUTANEOUS | 1 refills | Status: DC

## 2021-12-28 NOTE — Patient Instructions (Signed)

## 2021-12-28 NOTE — Progress Notes (Signed)
? ?  Follow-Up Visit ?  ?Subjective  ?Brenda Burns is a 66 y.o. female who presents for the following: Cyst (Right medial gluteal cleft, patient presents for excision). ? ? ?The following portions of the chart were reviewed this encounter and updated as appropriate:  ?  ?  ? ?Review of Systems:  No other skin or systemic complaints except as noted in HPI or Assessment and Plan. ? ?Objective  ?Well appearing patient in no apparent distress; mood and affect are within normal limits. ? ?A focused examination was performed including buttocks. Relevant physical exam findings are noted in the Assessment and Plan. ? ?right medial gluteal cleft ?Subcutaneous nodule with punctum 0.8 cm ? ? ? ?Assessment & Plan  ?Neoplasm of uncertain behavior of skin ?right medial gluteal cleft ? ?Skin excision ? ?Lesion length (cm):  0.8 ?Lesion width (cm):  0.8 ?Margin per side (cm):  0.1 ?Total excision diameter (cm):  1 ?Informed consent: discussed and consent obtained   ?Timeout: patient name, date of birth, surgical site, and procedure verified   ?Procedure prep:  Patient was prepped and draped in usual sterile fashion ?Prep type:  Povidone-iodine ?Anesthesia: the lesion was anesthetized in a standard fashion   ?Anesthesia comment:  Total 9 cc - 3 cc lido w/epi, 6 cc bupivicaine ?Anesthetic:  1% lidocaine w/ epinephrine 1-100,000 buffered w/ 8.4% NaHCO3 (0.5% bupivicaine) ?Instrument used comment:  #15c blade ?Hemostasis achieved with: pressure   ?Outcome: patient tolerated procedure well with no complications   ? ?Skin repair ?Complexity:  Intermediate ?Final length (cm):  1.1 ?Informed consent: discussed and consent obtained   ?Timeout: patient name, date of birth, surgical site, and procedure verified   ?Reason for type of repair: reduce tension to allow closure, reduce the risk of dehiscence, infection, and necrosis, reduce subcutaneous dead space and avoid a hematoma, preserve normal anatomical and functional relationships and  enhance both functionality and cosmetic results   ?Undermining: edges undermined   ?Subcutaneous layers (deep stitches):  ?Suture size:  4-0 ?Suture type: Vicryl (polyglactin 910)   ?Stitches:  Buried vertical mattress ?Fine/surface layer approximation (top stitches):  ?Suture size:  4-0 ?Suture type: nylon   ?Stitches: simple interrupted   ?Suture removal (days):  7 ?Hemostasis achieved with: suture ?Outcome: patient tolerated procedure well with no complications   ?Post-procedure details: sterile dressing applied and wound care instructions given   ?Dressing type: pressure dressing (mupirocin)   ? ?mupirocin ointment (BACTROBAN) 2 % ?Apply 1 application. topically daily. Qd to excision site ? ?Specimen 1 - Surgical pathology ?Differential Diagnosis: Cyst vs other ?Check Margins: No ?Subcutaneous nodule with punctum 0.8 cm ? ?Start Mupirocin oint qd to excision site ? ? ?Return in about 1 week (around 01/04/2022) for suture removal. ? ?Documentation: I have reviewed the above documentation for accuracy and completeness, and I agree with the above. ? ?[name]  ?

## 2021-12-29 ENCOUNTER — Telehealth: Payer: Self-pay

## 2021-12-29 NOTE — Telephone Encounter (Signed)
Talked to patient and she is doing fine from yesterday's surgery.  ?

## 2022-01-04 ENCOUNTER — Ambulatory Visit (INDEPENDENT_AMBULATORY_CARE_PROVIDER_SITE_OTHER): Payer: Medicare PPO | Admitting: Dermatology

## 2022-01-04 DIAGNOSIS — L720 Epidermal cyst: Secondary | ICD-10-CM

## 2022-01-04 DIAGNOSIS — Z4802 Encounter for removal of sutures: Secondary | ICD-10-CM

## 2022-01-04 NOTE — Progress Notes (Signed)
? ?  Follow-Up Visit ?  ?Subjective  ?Brenda Burns is a 66 y.o. female who presents for the following: cyst bx proven (R medial gluteal cleft, 1 week f/u). ? ? ?The following portions of the chart were reviewed this encounter and updated as appropriate:  ?  ?  ? ?Review of Systems:  No other skin or systemic complaints except as noted in HPI or Assessment and Plan. ? ?Objective  ?Well appearing patient in no apparent distress; mood and affect are within normal limits. ? ?A focused examination was performed including buttocks. Relevant physical exam findings are noted in the Assessment and Plan. ? ?R medial gluteal cleft ?Healing excision site ? ? ? ?Assessment & Plan  ?Epidermal cyst ?R medial gluteal cleft ? ?Cyst, bx proven ? ?Encounter for Removal of Sutures ?- Incision site at the R medial gluteal cleft is clean, dry and intact ?- Wound cleansed, sutures removed, wound cleansed and steri strips applied.  ?- Discussed pathology results showing epidermal cyst  ?- Patient advised to keep steri-strips dry until they fall off. ?- Scars remodel for a full year. ?- Once steri-strips fall off, patient can apply over-the-counter silicone scar cream each night to help with scar remodeling if desired. ?- Patient advised to call with any concerns or if they notice any new or changing lesions.  ? ? ?Return for as scheduled . ? ?I, Othelia Pulling, RMA, am acting as scribe for [name], MD . ? ? ?Documentation: I have reviewed the above documentation for accuracy and completeness, and I agree with the above. ? ?[name]  ?

## 2022-01-04 NOTE — Patient Instructions (Signed)

## 2022-01-20 ENCOUNTER — Encounter: Payer: Medicare PPO | Admitting: Internal Medicine

## 2022-04-26 DIAGNOSIS — Z85828 Personal history of other malignant neoplasm of skin: Secondary | ICD-10-CM | POA: Diagnosis not present

## 2022-04-26 DIAGNOSIS — E785 Hyperlipidemia, unspecified: Secondary | ICD-10-CM | POA: Diagnosis not present

## 2022-04-26 DIAGNOSIS — Z825 Family history of asthma and other chronic lower respiratory diseases: Secondary | ICD-10-CM | POA: Diagnosis not present

## 2022-04-26 DIAGNOSIS — Z803 Family history of malignant neoplasm of breast: Secondary | ICD-10-CM | POA: Diagnosis not present

## 2022-04-26 DIAGNOSIS — Z823 Family history of stroke: Secondary | ICD-10-CM | POA: Diagnosis not present

## 2022-04-26 DIAGNOSIS — Z885 Allergy status to narcotic agent status: Secondary | ICD-10-CM | POA: Diagnosis not present

## 2022-04-26 DIAGNOSIS — Z8249 Family history of ischemic heart disease and other diseases of the circulatory system: Secondary | ICD-10-CM | POA: Diagnosis not present

## 2022-04-26 DIAGNOSIS — R030 Elevated blood-pressure reading, without diagnosis of hypertension: Secondary | ICD-10-CM | POA: Diagnosis not present

## 2022-04-28 ENCOUNTER — Encounter: Payer: Medicare PPO | Admitting: Internal Medicine

## 2022-06-24 ENCOUNTER — Other Ambulatory Visit: Payer: Self-pay | Admitting: Internal Medicine

## 2022-07-13 ENCOUNTER — Encounter: Payer: Self-pay | Admitting: Internal Medicine

## 2022-07-13 ENCOUNTER — Telehealth: Payer: Self-pay

## 2022-07-13 ENCOUNTER — Ambulatory Visit (INDEPENDENT_AMBULATORY_CARE_PROVIDER_SITE_OTHER): Payer: Medicare PPO | Admitting: Internal Medicine

## 2022-07-13 VITALS — BP 130/82 | HR 65 | Temp 98.2°F | Ht 64.0 in | Wt 162.4 lb

## 2022-07-13 DIAGNOSIS — F439 Reaction to severe stress, unspecified: Secondary | ICD-10-CM

## 2022-07-13 DIAGNOSIS — Z800 Family history of malignant neoplasm of digestive organs: Secondary | ICD-10-CM

## 2022-07-13 DIAGNOSIS — J411 Mucopurulent chronic bronchitis: Secondary | ICD-10-CM

## 2022-07-13 DIAGNOSIS — Z23 Encounter for immunization: Secondary | ICD-10-CM | POA: Diagnosis not present

## 2022-07-13 DIAGNOSIS — Z1231 Encounter for screening mammogram for malignant neoplasm of breast: Secondary | ICD-10-CM | POA: Diagnosis not present

## 2022-07-13 DIAGNOSIS — Z0000 Encounter for general adult medical examination without abnormal findings: Secondary | ICD-10-CM

## 2022-07-13 DIAGNOSIS — Z803 Family history of malignant neoplasm of breast: Secondary | ICD-10-CM

## 2022-07-13 DIAGNOSIS — D696 Thrombocytopenia, unspecified: Secondary | ICD-10-CM

## 2022-07-13 DIAGNOSIS — E7800 Pure hypercholesterolemia, unspecified: Secondary | ICD-10-CM | POA: Diagnosis not present

## 2022-07-13 LAB — LIPID PANEL
Cholesterol: 209 mg/dL — ABNORMAL HIGH (ref 0–200)
HDL: 69 mg/dL (ref >39.00)
LDL Cholesterol: 123 mg/dL — ABNORMAL HIGH (ref 0–99)
NonHDL: 140.09
Total CHOL/HDL Ratio: 3
Triglycerides: 87 mg/dL (ref 0.0–149.0)
VLDL: 17.4 mg/dL (ref 0.0–40.0)

## 2022-07-13 LAB — CBC WITH DIFFERENTIAL/PLATELET
Basophils Absolute: 0 10*3/uL (ref 0.0–0.1)
Basophils Relative: 0.7 % (ref 0.0–3.0)
Eosinophils Absolute: 0.1 10*3/uL (ref 0.0–0.7)
Eosinophils Relative: 2.7 % (ref 0.0–5.0)
HCT: 41.8 % (ref 36.0–46.0)
Hemoglobin: 14 g/dL (ref 12.0–15.0)
Lymphocytes Relative: 34.5 % (ref 12.0–46.0)
Lymphs Abs: 1.3 10*3/uL (ref 0.7–4.0)
MCHC: 33.5 g/dL (ref 30.0–36.0)
MCV: 87.1 fl (ref 78.0–100.0)
Monocytes Absolute: 0.3 10*3/uL (ref 0.1–1.0)
Monocytes Relative: 8.5 % (ref 3.0–12.0)
Neutro Abs: 2 10*3/uL (ref 1.4–7.7)
Neutrophils Relative %: 53.6 % (ref 43.0–77.0)
Platelets: 186 10*3/uL (ref 150.0–400.0)
RBC: 4.8 Mil/uL (ref 3.87–5.11)
RDW: 13.7 % (ref 11.5–15.5)
WBC: 3.7 10*3/uL — ABNORMAL LOW (ref 4.0–10.5)

## 2022-07-13 LAB — COMPREHENSIVE METABOLIC PANEL
ALT: 21 U/L (ref 0–35)
AST: 20 U/L (ref 0–37)
Albumin: 4.5 g/dL (ref 3.5–5.2)
Alkaline Phosphatase: 89 U/L (ref 39–117)
BUN: 14 mg/dL (ref 6–23)
CO2: 27 mEq/L (ref 19–32)
Calcium: 9.6 mg/dL (ref 8.4–10.5)
Chloride: 102 mEq/L (ref 96–112)
Creatinine, Ser: 0.71 mg/dL (ref 0.40–1.20)
GFR: 87.4 mL/min (ref >60.00)
Glucose, Bld: 91 mg/dL (ref 70–99)
Potassium: 4.2 mEq/L (ref 3.5–5.1)
Sodium: 139 mEq/L (ref 135–145)
Total Bilirubin: 0.6 mg/dL (ref 0.2–1.2)
Total Protein: 7.1 g/dL (ref 6.0–8.3)

## 2022-07-13 MED ORDER — DOXYCYCLINE HYCLATE 100 MG PO TABS: 100.0000 mg | ORAL_TABLET | Freq: Two times a day (BID) | ORAL | 0 refills | Status: DC

## 2022-07-13 NOTE — Telephone Encounter (Signed)
Close  

## 2022-07-13 NOTE — Assessment & Plan Note (Addendum)
Physical today 07/13/22.  S/p hysterectomy.  Mammogram 09/21/21 - Birads I.  Scheduled for f/u mammogram 09/22/22 - 10:00.  Colonoscopy 09/16/21 - non bleeding internal hemorrhoids, otherwise normal.  Recommended f/u in 5 years.

## 2022-07-13 NOTE — Patient Instructions (Signed)
Bone density and mammogram 09/22/22 - 10:00

## 2022-07-13 NOTE — Progress Notes (Signed)
[name], female   DOB: [birth year], 66 y.o.   MRN: [id number]   Subjective:    [name], female    DOB: 1956-08-28, 66 y.o.   MRN: [id number]   Patient here for  Chief Complaint  Patient presents with   Follow-up    Physical   .   HPI States she is doing relatively well. Increased stress.  Overall appears to be handling things relatively well.  No chest pain or sob reported.  No abdominal pain or bowel change reported.  Saw GI recently.  Colonscopy 09/16/21 - non bleeding internal hemorrhoids, other wise normal.  Recommended f/u colonoscopy in 5 years.  Some arthritis - hands.  Discussed family history and genetic counseling.     Past Medical History:  Diagnosis Date   Arthritis    Asthma    Basal cell carcinoma 08/01/2017   Left spinal mid lower back. Nodular.   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 Excised 11/26/2009, margins free.   Past Surgical History:  Procedure Laterality Date   ABDOMINAL HYSTERECTOMY  2004   endometriosis   TONSILLECTOMY  1060's   Family History  Problem Relation Age of Onset   COPD Mother    Cancer Mother        breast and colon   Breast cancer Mother 57   Diabetes Paternal Grandfather    Social History   Socioeconomic History   Marital status: Married    Spouse name: Not on file   Number of children: Not on file   Years of education: Not on file   Highest education level: Not on file  Occupational History   Not on file  Tobacco Use   Smoking status: Never   Smokeless tobacco: Never  Substance and Sexual Activity   Alcohol use: Yes    Comment: glass of wine occasionally   Drug use: No   Sexual activity: Yes    Birth control/protection: None  Other Topics Concern   Not on file  Social History Narrative    Not on file   Social Determinants of Health   Financial Resource Strain: Low Risk  (12/10/2021)   Overall Financial Resource Strain (CARDIA)    Difficulty of Paying Living Expenses: Not hard at all  Food Insecurity: No Food Insecurity (12/10/2021)   Hunger Vital Sign    Worried About Running Out of Food in the Last Year: Never true    Ran Out of Food in the Last Year: Never true  Transportation Needs: No Transportation Needs (12/10/2021)   PRAPARE - Hydrologist (Medical): No    Lack of Transportation (Non-Medical): No  Physical Activity: Insufficiently Active (12/10/2021)   Exercise Vital Sign    Days of Exercise per Week: 3 days    Minutes of Exercise per Session: 30 min  Stress: No Stress Concern Present (12/10/2021)   [place]    Feeling of Stress : Not at all  Social Connections: Moderately Integrated (12/10/2021)   Social Connection and Isolation Panel [NHANES]    Frequency of Communication with Friends and Family: More than three times a week    Frequency of Social Gatherings with Friends and Family: Once a  week    Attends Religious Services: 1 to 4 times per year    Active Member of Clubs or Organizations: No    Attends Archivist Meetings: Patient refused    Marital Status: Married     Review of Systems  Constitutional:  Negative for appetite change and unexpected weight change.  HENT:  Negative for congestion, sinus pressure and sore throat.   Eyes:  Negative for pain and visual disturbance.  Respiratory:  Negative for cough, chest tightness and shortness of breath.   Cardiovascular:  Negative for chest pain, palpitations and leg swelling.  Gastrointestinal:  Negative for abdominal pain, diarrhea, nausea and vomiting.  Genitourinary:  Negative for difficulty urinating and dysuria.  Musculoskeletal:  Negative for myalgias and neck stiffness.  Skin:  Negative for color  change and rash.  Neurological:  Negative for dizziness, light-headedness and headaches.  Hematological:  Negative for adenopathy. Does not bruise/bleed easily.  Psychiatric/Behavioral:  Negative for agitation and dysphoric mood.        Objective:     BP 130/82 (BP Location: Left Arm, Patient Position: Sitting, Cuff Size: Normal)   Pulse 65   Temp 98.2 F (36.8 C) (Oral)   Ht '5\' 4"'$  (1.626 m)   Wt 162 lb 6.4 oz (73.7 kg)   SpO2 96%   BMI 27.88 kg/m  Wt Readings from Last 3 Encounters:  07/13/22 162 lb 6.4 oz (73.7 kg)  04/23/21 167 lb 3.2 oz (75.8 kg)  08/16/19 164 lb 3.2 oz (74.5 kg)    Physical Exam Vitals reviewed.  Constitutional:      General: She is not in acute distress.    Appearance: Normal appearance. She is well-developed.  HENT:     Head: Normocephalic and atraumatic.     Right Ear: External ear normal.     Left Ear: External ear normal.  Eyes:     General: No scleral icterus.       Right eye: No discharge.        Left eye: No discharge.     Conjunctiva/sclera: Conjunctivae normal.  Neck:     Thyroid: No thyromegaly.  Cardiovascular:     Rate and Rhythm: Normal rate and regular rhythm.  Pulmonary:     Effort: No tachypnea, accessory muscle usage or respiratory distress.     Breath sounds: Normal breath sounds. No decreased breath sounds or wheezing.  Chest:  Breasts:    Right: No inverted nipple, mass, nipple discharge or tenderness (no axillary adenopathy).     Left: No inverted nipple, mass, nipple discharge or tenderness (no axilarry adenopathy).  Abdominal:     General: Bowel sounds are normal.     Palpations: Abdomen is soft.     Tenderness: There is no abdominal tenderness.  Musculoskeletal:        General: No swelling or tenderness.     Cervical back: Neck supple.  Lymphadenopathy:     Cervical: No cervical adenopathy.  Skin:    Findings: No erythema or rash.  Neurological:     Mental Status: She is alert and oriented to person, place,  and time.  Psychiatric:        Mood and Affect: Mood normal.        Behavior: Behavior normal.      Outpatient Encounter Medications as of 07/13/2022  Medication Sig   albuterol (VENTOLIN HFA) 108 (90 Base) MCG/ACT inhaler Inhale 2 puffs into the lungs every 6 (six) hours as needed for wheezing or shortness of  breath.   Calcium Carbonate-Vitamin D (CALTRATE 600+D PO) Take by mouth.   Multiple Vitamins-Minerals (CENTRUM SILVER PO) Take by mouth.   mupirocin ointment (BACTROBAN) 2 % Apply 1 application. topically daily. Qd to excision site   [DISCONTINUED] pravastatin (PRAVACHOL) 20 MG tablet TAKE 1 TABLET BY MOUTH DAILY   doxycycline (VIBRA-TABS) 100 MG tablet Take 1 tablet (100 mg total) by mouth 2 (two) times daily.   [DISCONTINUED] doxycycline (VIBRA-TABS) 100 MG tablet Take 1 tablet (100 mg total) by mouth 2 (two) times daily. (Patient not taking: Reported on 12/10/2021)   [DISCONTINUED] predniSONE (DELTASONE) 10 MG tablet Take 6 tablets x 1 day and then decrease by 1/2 tablet per day until down to zero mg. (Patient not taking: Reported on 12/10/2021)   No facility-administered encounter medications on file as of 07/13/2022.     Lab Results  Component Value Date   WBC 3.7 (L) 07/13/2022   HGB 14.0 07/13/2022   HCT 41.8 07/13/2022   PLT 186.0 07/13/2022   GLUCOSE 91 07/13/2022   CHOL 209 (H) 07/13/2022   TRIG 87.0 07/13/2022   HDL 69.00 07/13/2022   LDLCALC 123 (H) 07/13/2022   ALT 21 07/13/2022   AST 20 07/13/2022   NA 139 07/13/2022   K 4.2 07/13/2022   CL 102 07/13/2022   CREATININE 0.71 07/13/2022   BUN 14 07/13/2022   CO2 27 07/13/2022   TSH 1.69 07/13/2022    MM 3D SCREEN BREAST BILATERAL  Result Date: 09/22/2021 CLINICAL DATA:  Screening. EXAM: DIGITAL SCREENING BILATERAL MAMMOGRAM WITH TOMOSYNTHESIS AND CAD TECHNIQUE: Bilateral screening digital craniocaudal and mediolateral oblique mammograms were obtained. Bilateral screening digital breast tomosynthesis was  performed. The images were evaluated with computer-aided detection. COMPARISON:  Previous exam(s). ACR Breast Density Category b: There are scattered areas of fibroglandular density. FINDINGS: There are no findings suspicious for malignancy. IMPRESSION: No mammographic evidence of malignancy. A result letter of this screening mammogram will be mailed directly to the patient. RECOMMENDATION: Screening mammogram in one year. (Code:SM-B-01Y) BI-RADS CATEGORY  1: Negative. Electronically Signed   By: Audie Pinto M.D.   On: 09/22/2021 09:46      Assessment & Plan:   Problem List Items Addressed This Visit     Bronchitis, mucopurulent recurrent ([place])    Has a history of recurrent bronchitis.  rx for doxycycline to have if needed. Follow.  Call if problems.       Family history of breast cancer    Mother with breast cancer.  Request referral for genetic counseling.        Relevant Orders   Ambulatory referral to Genetics   Family history of colon cancer    Colonoscopy 09/16/21 - non bleeding internal hemorrhoids, otherwise normal.  Recommended f/u in 5 years.        Relevant Orders   Ambulatory referral to [employer] maintenance    Physical today 07/13/22.  S/p hysterectomy.  Mammogram 09/21/21 - Birads I.  Scheduled for f/u mammogram 09/22/22 - 10:00.  Colonoscopy 09/16/21 - non bleeding internal hemorrhoids, otherwise normal.  Recommended f/u in 5 years.        Hypercholesterolemia    Had intolerance to multiple statin medications.  Low cholesterol diet and exercise.  Follow lipid panel.  Continue pravastatin.       Stress    Increased stress.  Discussed.  Overall appears to be handling things relatively well.  Follow.       Thrombocytopenia, unspecified ([place])  Follow cbc.       Other Visit Diagnoses     Encounter for screening mammogram for malignant neoplasm of breast       Relevant Orders   MM 3D SCREEN BREAST BILATERAL   Hypercholesteremia        Relevant Orders   CBC with Differential/Platelet (Completed)   Comprehensive metabolic panel (Completed)   Lipid panel (Completed)   TSH (Completed)   Need for immunization against influenza       Relevant Orders   Flu Vaccine QUAD High Dose(Fluad) (Completed)        Einar Pheasant, MD

## 2022-07-14 LAB — TSH: TSH: 1.69 u[IU]/mL (ref 0.35–5.50)

## 2022-07-16 ENCOUNTER — Other Ambulatory Visit: Payer: Self-pay | Admitting: Internal Medicine

## 2022-07-16 MED ORDER — PRAVASTATIN SODIUM 40 MG PO TABS: 40.0000 mg | ORAL_TABLET | Freq: Every day | ORAL | 1 refills | Status: DC

## 2022-07-16 NOTE — Progress Notes (Signed)
Rx sent in for pravastatin '40mg'$  #90 with one refill.

## 2022-07-16 NOTE — Progress Notes (Signed)
Rx sent in for pravastatin '40mg'$ .

## 2022-07-18 ENCOUNTER — Encounter: Payer: Self-pay | Admitting: Internal Medicine

## 2022-07-18 DIAGNOSIS — Z803 Family history of malignant neoplasm of breast: Secondary | ICD-10-CM | POA: Insufficient documentation

## 2022-07-18 NOTE — Assessment & Plan Note (Signed)
Has a history of recurrent bronchitis.  rx for doxycycline to have if needed. Follow.  Call if problems.

## 2022-07-18 NOTE — Assessment & Plan Note (Addendum)
Had intolerance to multiple statin medications.  Low cholesterol diet and exercise.  Follow lipid panel.  Continue pravastatin.

## 2022-07-18 NOTE — Assessment & Plan Note (Signed)
Follow cbc.  

## 2022-07-18 NOTE — Assessment & Plan Note (Signed)
Mother with breast cancer.  Request referral for genetic counseling.

## 2022-07-18 NOTE — Assessment & Plan Note (Signed)
Increased stress.  Discussed.  Overall appears to be handling things relatively well.  Follow.  

## 2022-07-18 NOTE — Assessment & Plan Note (Signed)
Colonoscopy 09/16/21 - non bleeding internal hemorrhoids, otherwise normal.  Recommended f/u in 5 years.

## 2022-07-27 ENCOUNTER — Other Ambulatory Visit: Payer: Medicare PPO

## 2022-08-18 ENCOUNTER — Other Ambulatory Visit: Payer: Medicare PPO

## 2022-08-18 ENCOUNTER — Encounter: Payer: Medicare PPO | Admitting: Licensed Clinical Social Worker

## 2022-08-30 ENCOUNTER — Ambulatory Visit: Payer: Medicare PPO | Admitting: Dermatology

## 2022-08-30 DIAGNOSIS — L814 Other melanin hyperpigmentation: Secondary | ICD-10-CM

## 2022-08-30 DIAGNOSIS — Z85828 Personal history of other malignant neoplasm of skin: Secondary | ICD-10-CM

## 2022-08-30 DIAGNOSIS — D489 Neoplasm of uncertain behavior, unspecified: Secondary | ICD-10-CM

## 2022-08-30 DIAGNOSIS — D2371 Other benign neoplasm of skin of right lower limb, including hip: Secondary | ICD-10-CM

## 2022-08-30 DIAGNOSIS — L578 Other skin changes due to chronic exposure to nonionizing radiation: Secondary | ICD-10-CM

## 2022-08-30 DIAGNOSIS — L821 Other seborrheic keratosis: Secondary | ICD-10-CM | POA: Diagnosis not present

## 2022-08-30 DIAGNOSIS — Z86006 Personal history of melanoma in-situ: Secondary | ICD-10-CM

## 2022-08-30 DIAGNOSIS — D485 Neoplasm of uncertain behavior of skin: Secondary | ICD-10-CM

## 2022-08-30 DIAGNOSIS — Z1283 Encounter for screening for malignant neoplasm of skin: Secondary | ICD-10-CM

## 2022-08-30 DIAGNOSIS — L570 Actinic keratosis: Secondary | ICD-10-CM

## 2022-08-30 DIAGNOSIS — D229 Melanocytic nevi, unspecified: Secondary | ICD-10-CM

## 2022-08-30 DIAGNOSIS — D2372 Other benign neoplasm of skin of left lower limb, including hip: Secondary | ICD-10-CM

## 2022-08-30 DIAGNOSIS — D239 Other benign neoplasm of skin, unspecified: Secondary | ICD-10-CM

## 2022-08-30 DIAGNOSIS — D1801 Hemangioma of skin and subcutaneous tissue: Secondary | ICD-10-CM | POA: Diagnosis not present

## 2022-08-30 NOTE — Patient Instructions (Signed)
Actinic keratoses are precancerous spots that appear secondary to cumulative UV radiation exposure/sun exposure over time. They are chronic with expected duration over 1 year. A portion of actinic keratoses will progress to squamous cell carcinoma of the skin. It is not possible to reliably predict which spots will progress to skin cancer and so treatment is recommended to prevent development of skin cancer.  Recommend daily broad spectrum sunscreen SPF 30+ to sun-exposed areas, reapply every 2 hours as needed.  Recommend staying in the shade or wearing long sleeves, sun glasses (UVA+UVB protection) and wide brim hats (4-inch brim around the entire circumference of the hat). Call for new or changing lesions.  Cryotherapy Aftercare  Wash gently with soap and water everyday.   Apply Vaseline and Band-Aid daily until healed.    Wound Care Instructions  Cleanse wound gently with soap and water once a day then pat dry with clean gauze. Apply a thin coat of Petrolatum (petroleum jelly, "Vaseline") over the wound (unless you have an allergy to this). We recommend that you use a new, sterile tube of Vaseline. Do not pick or remove scabs. Do not remove the yellow or white "healing tissue" from the base of the wound.  Cover the wound with fresh, clean, nonstick gauze and secure with paper tape. You may use Band-Aids in place of gauze and tape if the wound is small enough, but would recommend trimming much of the tape off as there is often too much. Sometimes Band-Aids can irritate the skin.  You should call the office for your biopsy report after 1 week if you have not already been contacted.  If you experience any problems, such as abnormal amounts of bleeding, swelling, significant bruising, significant pain, or evidence of infection, please call the office immediately.  FOR ADULT SURGERY PATIENTS: If you need something for pain relief you may take 1 extra strength Tylenol (acetaminophen) AND 2  Ibuprofen ('200mg'$  each) together every 4 hours as needed for pain. (do not take these if you are allergic to them or if you have a reason you should not take them.) Typically, you may only need pain medication for 1 to 3 days.     Due to recent changes in healthcare laws, you may see results of your pathology and/or laboratory studies on MyChart before the doctors have had a chance to review them. We understand that in some cases there may be results that are confusing or concerning to you. Please understand that not all results are received at the same time and often the doctors may need to interpret multiple results in order to provide you with the best plan of care or course of treatment. Therefore, we ask that you please give [place] 2 business days to thoroughly review all your results before contacting the office for clarification. Should we see a critical lab result, you will be contacted sooner.   If You Need Anything After Your Visit  If you have any questions or concerns for your doctor, please call our main line at [phone] and press option 4 to reach your doctor's medical assistant. If no one answers, please leave a voicemail as directed and we will return your call as soon as possible. Messages left after 4 pm will be answered the following business day.   You may also send [place] a message via [place]. We typically respond to MyChart messages within 1-2 business days.  For prescription refills, please ask your pharmacy to contact our office. Our fax number is  [phone].  If you have an urgent issue when the clinic is closed that cannot wait until the next business day, you can page your doctor at the number below.    Please note that while we do our best to be available for urgent issues outside of office hours, we are not available 24/7.   If you have an urgent issue and are unable to reach [place], you may choose to seek medical care at your doctor's office, retail clinic, urgent care  center, or emergency room.  If you have a medical emergency, please immediately call 911 or go to the emergency department.  Pager Numbers  - Dr. Nehemiah Massed: [phone]  - Dr. Laurence Ferrari: [phone]  - Dr. Nicole Kindred: [phone]  In the event of inclement weather, please call our main line at [phone] for an update on the status of any delays or closures.  Dermatology Medication Tips: Please keep the boxes that topical medications come in in order to help keep track of the instructions about where and how to use these. Pharmacies typically print the medication instructions only on the boxes and not directly on the medication tubes.   If your medication is too expensive, please contact our office at [phone] option 4 or send [place] a message through [place].   We are unable to tell what your co-pay for medications will be in advance as this is different depending on your insurance coverage. However, we may be able to find a substitute medication at lower cost or fill out paperwork to get insurance to cover a needed medication.   If a prior authorization is required to get your medication covered by your insurance company, please allow [place] 1-2 business days to complete this process.  Drug prices often vary depending on where the prescription is filled and some pharmacies may offer cheaper prices.  The website www.goodrx.com contains coupons for medications through different pharmacies. The prices here do not account for what the cost may be with help from insurance (it may be cheaper with your insurance), but the website can give you the price if you did not use any insurance.  - You can print the associated coupon and take it with your prescription to the pharmacy.  - You may also stop by our office during regular business hours and pick up a GoodRx coupon card.  - If you need your prescription sent electronically to a different pharmacy, notify our office through [facility] or by  phone at [phone] option 4.     Si Usted Necesita Algo Despus de Su Visita  Tambin puede enviarnos un mensaje a travs de Pharmacist, community. Por lo general respondemos a los mensajes de MyChart en el transcurso de 1 a 2 das hbiles.  Para renovar recetas, por favor pida a su farmacia que se ponga en contacto con nuestra oficina. Harland Dingwall de fax es [place] [phone].  Si tiene un asunto urgente cuando la clnica est cerrada y que no puede esperar hasta el siguiente da hbil, puede llamar/localizar a su doctor(a) al nmero que aparece a continuacin.   Por favor, tenga en cuenta que aunque hacemos todo lo posible para estar disponibles para asuntos urgentes fuera del horario de [place], no estamos disponibles las 24 horas del da, los 7 das de la [place].   Si tiene un problema urgente y no puede comunicarse con nosotros, puede optar por buscar atencin mdica  en el consultorio de su doctor(a), en una clnica privada, en un centro de atencin urgente  o en una sala de emergencias.  Si tiene Engineering geologist, por favor llame inmediatamente al 911 o vaya a la sala de emergencias.  Nmeros de bper  - Dr. Nehemiah Massed: [phone]  - Dra. Moye: [phone]  - Dra. Nicole Kindred: [phone]  En caso de inclemencias del [place], por favor llame a Johnsie Kindred principal al [phone] para una actualizacin sobre el [place] de cualquier retraso o cierre.  Consejos para la medicacin en dermatologa: Por favor, guarde las cajas en las que vienen los medicamentos de uso tpico para ayudarle a seguir las instrucciones sobre dnde y cmo usarlos. Las farmacias generalmente imprimen las instrucciones del medicamento slo en las cajas y no directamente en los tubos del [place].   Si su medicamento es muy caro, por favor, pngase en contacto con Zigmund Daniel llamando al [phone] y presione la opcin 4 o envenos un mensaje a travs de Pharmacist, community.   No podemos decirle cul ser su copago  por los medicamentos por adelantado ya que esto es diferente dependiendo de la cobertura de su seguro. Sin embargo, es posible que podamos encontrar un medicamento sustituto a Electrical engineer un formulario para que el seguro cubra el medicamento que se considera necesario.   Si se requiere una autorizacin previa para que su compaa de seguros [place] su medicamento, por favor permtanos de 1 a 2 das hbiles para completar este proceso.  Los precios de los medicamentos varan con frecuencia dependiendo del Environmental consultant de dnde se surte la receta y alguna farmacias pueden ofrecer precios ms baratos.  El sitio web www.goodrx.com tiene cupones para medicamentos de Airline pilot. Los precios aqu no tienen en cuenta lo que podra costar con la ayuda del seguro (puede ser ms barato con su seguro), pero el sitio web puede darle el precio si no utiliz Research scientist (physical sciences).  - Puede imprimir el cupn correspondiente y llevarlo con su receta a la farmacia.  - Tambin puede pasar por nuestra oficina durante el horario de atencin regular y Charity fundraiser una tarjeta de cupones de GoodRx.  - Si necesita que su receta se enve electrnicamente a una farmacia diferente, informe a nuestra oficina a travs de MyChart de [place] o por telfono llamando al [phone] y presione la opcin 4.

## 2022-08-30 NOTE — Progress Notes (Signed)
Follow-Up Visit   Subjective  Brenda Burns is a 66 y.o. female who presents for the following: Annual Exam (History of Melanoma in situ and BCC - The patient presents for Total-Body Skin Exam (TBSE) for skin cancer screening and mole check.  The patient has spots, moles and lesions to be evaluated, some may be new or changing and the patient has concerns that these could be cancer./).  She has a spot on her back that gets irritated.    The following portions of the chart were reviewed this encounter and updated as appropriate:       Review of Systems:  No other skin or systemic complaints except as noted in HPI or Assessment and Plan.  Objective  Well appearing patient in no apparent distress; mood and affect are within normal limits.  A full examination was performed including scalp, head, eyes, ears, nose, lips, neck, chest, axillae, abdomen, back, buttocks, bilateral upper extremities, bilateral lower extremities, hands, feet, fingers, toes, fingernails, and toenails. All findings within normal limits unless otherwise noted below.  Spinal mid back 5 mm red papule  Left pretibial, Right upper calf 5 mm Firm pink/brown papulenodule with dimple sign of left pretibial. Firm pink/brown papulenodule with dimple sign of right upper calf.  Upper lip x 3, right lower cheek (4) Erythematous thin papules/macules with gritty scale.     Assessment & Plan   History of Melanoma in Situ of left buttock - No evidence of recurrence today - Recommend regular full body skin exams - Recommend daily broad spectrum sunscreen SPF 30+ to sun-exposed areas, reapply every 2 hours as needed.  - Call if any new or changing lesions are noted between office visits   History of Basal Cell Carcinoma of the Skin - No evidence of recurrence today - Recommend regular full body skin exams - Recommend daily broad spectrum sunscreen SPF 30+ to sun-exposed areas, reapply every 2 hours as needed.  - Call  if any new or changing lesions are noted between office visits  Lentigines - Scattered tan macules - Due to sun exposure - Benign-appearing, observe - Recommend daily broad spectrum sunscreen SPF 30+ to sun-exposed areas, reapply every 2 hours as needed. - Call for any changes  Seborrheic Keratoses - Stuck-on, waxy, tan-brown papules and/or plaques  - Benign-appearing - Discussed benign etiology and prognosis. - Observe - Call for any changes  Melanocytic Nevi - Tan-brown and/or pink-flesh-colored symmetric macules and papules - Benign appearing on exam today - Observation - Call clinic for new or changing moles - Recommend daily use of broad spectrum spf 30+ sunscreen to sun-exposed areas.   Hemangiomas - Red papules - Discussed benign nature - Observe - Call for any changes  Actinic Damage - Chronic condition, secondary to cumulative UV/sun exposure - diffuse scaly erythematous macules with underlying dyspigmentation - Recommend daily broad spectrum sunscreen SPF 30+ to sun-exposed areas, reapply every 2 hours as needed.  - Staying in the shade or wearing long sleeves, sun glasses (UVA+UVB protection) and wide brim hats (4-inch brim around the entire circumference of the hat) are also recommended for sun protection.  - Call for new or changing lesions.  Skin cancer screening performed today.  Neoplasm of uncertain behavior Spinal mid back  Epidermal / dermal shaving  Lesion diameter (cm):  0.5 Informed consent: discussed and consent obtained   Timeout: patient name, date of birth, surgical site, and procedure verified   Patient was prepped and draped in usual sterile fashion:  area prepped with alcohol. Anesthesia: the lesion was anesthetized in a standard fashion   Anesthetic:  1% lidocaine w/ epinephrine 1-100,000 local infiltration Instrument used: flexible razor blade   Hemostasis achieved with: pressure, aluminum chloride and electrodesiccation   Outcome:  patient tolerated procedure well   Post-procedure details: wound care instructions given   Post-procedure details comment:  Ointment and a small bandage applied  Specimen 1 - Surgical pathology Differential Diagnosis: Irritated Hemangioma vs other Check Margins: No    Dermatofibroma (2) Left pretibial; Right upper calf  A dermatofibroma is a benign growth possibly related to trauma, such as an insect bite or inflamed acne-type bump.  Discussed removal (shave vrs excision) with resulting scar and risk of recurrence.  Since not bothersome, will observe for now.   AK (actinic keratosis) (4) Upper lip x 3, right lower cheek  Actinic keratoses are precancerous spots that appear secondary to cumulative UV radiation exposure/sun exposure over time. They are chronic with expected duration over 1 year. A portion of actinic keratoses will progress to squamous cell carcinoma of the skin. It is not possible to reliably predict which spots will progress to skin cancer and so treatment is recommended to prevent development of skin cancer.  Recommend daily broad spectrum sunscreen SPF 30+ to sun-exposed areas, reapply every 2 hours as needed.  Recommend staying in the shade or wearing long sleeves, sun glasses (UVA+UVB protection) and wide brim hats (4-inch brim around the entire circumference of the hat). Call for new or changing lesions.   Destruction of lesion - Upper lip x 3, right lower cheek  Destruction method: cryotherapy   Informed consent: discussed and consent obtained   Timeout:  patient name, date of birth, surgical site, and procedure verified Lesion destroyed using liquid nitrogen: Yes   Region frozen until ice ball extended beyond lesion: Yes   Outcome: patient tolerated procedure well with no complications   Post-procedure details: wound care instructions given   Additional details:  Prior to procedure, discussed risks of blister formation, small wound, skin dyspigmentation, or  rare scar following cryotherapy. Recommend Vaseline ointment to treated areas while healing.    Return in about 6 months (around 02/28/2023) for Sun Exposed Areas, AK follow up.  I, Ashok Cordia, CMA, am acting as scribe for [name], MD .  Documentation: I have reviewed the above documentation for accuracy and completeness, and I agree with the above.  [name]

## 2022-09-06 ENCOUNTER — Telehealth: Payer: Self-pay

## 2022-09-06 NOTE — Telephone Encounter (Signed)
Advised pt of bx results/sh ?

## 2022-09-06 NOTE — Telephone Encounter (Signed)
-----   Message from [name], MD sent at 09/06/2022 12:56 PM EST ----- Skin , spinal mid back HEMANGIOMA  benign - please call patient

## 2022-09-08 ENCOUNTER — Telehealth: Payer: Self-pay | Admitting: Internal Medicine

## 2022-09-08 NOTE — Telephone Encounter (Signed)
Spoke with pt and she has been scheduled with Karl Ito, NP at [facility] tomorrow at 10:40.

## 2022-09-08 NOTE — Telephone Encounter (Signed)
S/w pt - stated cough is the only symptom that has returned. It was been about a week or so pt stated that the cough has been back. Pt is also spitting up light brown/tan mucous when she is coughing.  Pt wondering if this could be COPD? Or if she just needs 2nd round of Doxy

## 2022-09-08 NOTE — Telephone Encounter (Signed)
If persistent symptoms, needs to be evaluated.

## 2022-09-08 NOTE — Telephone Encounter (Signed)
Patient called and said that Dr Nicki Reaper put her doxycycline (VIBRA-TABS) 100 MG tablet, she was better for 4 days after finishing the medication. Now the cough is back, just as bad. Her mother had COPD and she is afraid that she might have it too. She would like to know what is the next step.

## 2022-09-09 ENCOUNTER — Encounter: Payer: Self-pay | Admitting: Nurse Practitioner

## 2022-09-09 ENCOUNTER — Ambulatory Visit
Admission: RE | Admit: 2022-09-09 | Discharge: 2022-09-09 | Disposition: A | Discharge status: Home / Self Care | Payer: Medicare PPO | Source: Ambulatory Visit | Attending: Nurse Practitioner | Admitting: Nurse Practitioner

## 2022-09-09 ENCOUNTER — Ambulatory Visit
Admission: RE | Admit: 2022-09-09 | Discharge: 2022-09-09 | Disposition: A | Discharge status: Home / Self Care | Payer: Medicare PPO | Attending: Nurse Practitioner | Admitting: Nurse Practitioner

## 2022-09-09 ENCOUNTER — Ambulatory Visit: Payer: Medicare PPO | Admitting: Nurse Practitioner

## 2022-09-09 VITALS — BP 130/72 | HR 65 | Temp 97.9°F | Ht 64.0 in | Wt 163.0 lb

## 2022-09-09 DIAGNOSIS — J411 Mucopurulent chronic bronchitis: Secondary | ICD-10-CM | POA: Diagnosis not present

## 2022-09-09 DIAGNOSIS — R051 Acute cough: Secondary | ICD-10-CM | POA: Diagnosis not present

## 2022-09-09 DIAGNOSIS — R059 Cough, unspecified: Secondary | ICD-10-CM | POA: Diagnosis not present

## 2022-09-09 DIAGNOSIS — R0982 Postnasal drip: Secondary | ICD-10-CM

## 2022-09-09 MED ORDER — FLUTICASONE PROPIONATE 50 MCG/ACT NA SUSP: 2.0000 | Freq: Every day | NASAL | 0 refills | Status: DC

## 2022-09-09 MED ORDER — LEVOCETIRIZINE DIHYDROCHLORIDE 5 MG PO TABS: 5.0000 mg | ORAL_TABLET | Freq: Every evening | ORAL | 0 refills | Status: DC

## 2022-09-09 NOTE — Assessment & Plan Note (Signed)
Multifactorial do think is secondary to postnasal drip patient to start fluticasone and second-generation histamine.  Pending chest x-ray today.

## 2022-09-09 NOTE — Progress Notes (Signed)
Acute Office Visit  Subjective:     [name], female    DOB: January 04, 1956, 66 y.o.   MRN: [id number]  Chief Complaint  Patient presents with   Cough    C/o ongoing cough, chest tightness and  scratchy throat. Sxs started end of Oct. Neg home Covid test. H/o bronchitis and PNA. Pt concerned about COPD.     Patient is in today for cough  States that she has chronic bronchitis and history of pna. States normally her PCP will give her doxycycline and prednisone States that she took the medication and the phelgm went away. States that she started with sympotms again this past Monday.  She has been having nausea, chest tightness, ears full. Sore throat Covid test was negative at home last week   Cough is worse night, it is  not dependent on position   Patient states that her mother had COPD and died of it and she is always on the look out for that.  Patient's mother was not a smoker but was in the office for smoker/second hand smoke exposure.  Did ask patient if her mother had an antitrypsin 1 deficiency and she states now.   Review of Systems  Constitutional:  Negative for chills and fever.  HENT:  Positive for ear pain (full). Negative for sinus pain and sore throat.   Respiratory:  Positive for cough and sputum production.   Cardiovascular:  Negative for chest pain (chest tightness).  Gastrointestinal:  Positive for nausea.  Musculoskeletal:  Negative for joint pain and myalgias.  Neurological:  Positive for headaches (post tussive).        Objective:    BP 130/72   Pulse 65   Temp 97.9 F (36.6 C) (Temporal)   Ht '5\' 4"'$  (1.626 m)   Wt 163 lb (73.9 kg)   SpO2 97%   BMI 27.98 kg/m    Physical Exam Vitals and nursing note reviewed.  Constitutional:      Appearance: Normal appearance.  HENT:     Right Ear: Tympanic membrane, ear canal and external ear normal.     Left Ear: Tympanic membrane, ear canal and external ear normal.     Nose:     Right  Sinus: No maxillary sinus tenderness or frontal sinus tenderness.     Left Sinus: No maxillary sinus tenderness or frontal sinus tenderness.     Mouth/Throat:     Mouth: Mucous membranes are moist.     Comments: PND "+" Cardiovascular:     Rate and Rhythm: Normal rate and regular rhythm.     Heart sounds: Normal heart sounds.  Pulmonary:     Effort: Pulmonary effort is normal.     Breath sounds: Normal breath sounds.  Lymphadenopathy:     Cervical: No cervical adenopathy.  Neurological:     Mental Status: She is alert.     No results found for any visits on 09/09/22.      Assessment & Plan:   Problem List Items Addressed This Visit       Respiratory   Bronchitis, mucopurulent recurrent ([place])    Patient would like a chest x-ray today pending chest x-ray result.      Relevant Orders   DG Chest 2 View     Other   Cough - Primary    Multifactorial do think is secondary to postnasal drip patient to start fluticasone and second-generation histamine.  Pending chest x-ray today.  Relevant Medications   fluticasone (FLONASE) 50 MCG/ACT nasal spray   levocetirizine (XYZAL) 5 MG tablet   Other Relevant Orders   DG Chest 2 View   PND (post-nasal drip)    Patient can start fluticasone nasal spray along with second-generation antihistamine.  Both prescription sent to pharmacy      Relevant Medications   fluticasone (FLONASE) 50 MCG/ACT nasal spray   levocetirizine (XYZAL) 5 MG tablet    Meds ordered this encounter  Medications   fluticasone (FLONASE) 50 MCG/ACT nasal spray    Sig: Place 2 sprays into both nostrils daily.    Dispense:  16 g    Refill:  0    Order Specific Question:   Supervising Provider    Answer:   Loura Pardon A [[id number]]   levocetirizine (XYZAL) 5 MG tablet    Sig: Take 1 tablet (5 mg total) by mouth every evening.    Dispense:  30 tablet    Refill:  0    Order Specific Question:   Supervising Provider    Answer:   TOWER, MARNE A [[id number]]     Return if symptoms worsen or fail to improve.  Romilda Garret, NP

## 2022-09-09 NOTE — Patient Instructions (Signed)
Nice to see you today I will be in touch with the xray results once I have them Follow up if you do not continue to improve

## 2022-09-09 NOTE — Assessment & Plan Note (Signed)
Patient would like a chest x-ray today pending chest x-ray result.

## 2022-09-09 NOTE — Assessment & Plan Note (Signed)
Patient can start fluticasone nasal spray along with second-generation antihistamine.  Both prescription sent to pharmacy

## 2022-09-14 ENCOUNTER — Telehealth: Payer: Self-pay | Admitting: Internal Medicine

## 2022-09-14 ENCOUNTER — Other Ambulatory Visit: Payer: Self-pay

## 2022-09-14 MED ORDER — DOXYCYCLINE HYCLATE 100 MG PO TABS: 100.0000 mg | ORAL_TABLET | Freq: Two times a day (BID) | ORAL | 0 refills | Status: DC

## 2022-09-14 NOTE — Telephone Encounter (Signed)
Lm for pt to cb : can she sched w/ Dr Maudie Mercury this evening for virtual ?

## 2022-09-14 NOTE — Telephone Encounter (Signed)
Please call and see how she is doing.  Specific symptoms?

## 2022-09-14 NOTE — Telephone Encounter (Signed)
Confirm only allergy is pcn.  Ok to refill doxycycline '100mg'$  bid x 7 days. If persistent symptoms, will need to be evaluated.

## 2022-09-14 NOTE — Telephone Encounter (Signed)
Pt advised Rx sent.

## 2022-09-14 NOTE — Telephone Encounter (Signed)
Patient returned office phone call, Brenda Burns is filled. Patient is demanding medication, she states Dr Nicki Reaper saw her last week for this issue.

## 2022-09-14 NOTE — Telephone Encounter (Signed)
S/w pt - stated last week when she saw NP Cable @ [facility], cough was clearing up and mucus was clear. Now cough is back, constant. Coughing up thick, green/yellow mucus. Hoarse voice.  No sore throat, body aches, fever, chest pain, s.o.b  Just constant coughing and coughing up mucus.   Pt asking for antibiotic to be sent into pharmacy. Was only given tessalon by [employer].

## 2022-09-14 NOTE — Telephone Encounter (Signed)
Pt called stating she was seen by james cable and he gave her some nasal spray and antihistamine. Pt is now coughing up green chunks and she would like to be called

## 2022-09-22 ENCOUNTER — Ambulatory Visit
Admission: RE | Admit: 2022-09-22 | Discharge: 2022-09-22 | Disposition: A | Discharge status: Home / Self Care | Payer: Medicare PPO | Source: Ambulatory Visit | Attending: Internal Medicine | Admitting: Internal Medicine

## 2022-09-22 DIAGNOSIS — M8589 Other specified disorders of bone density and structure, multiple sites: Secondary | ICD-10-CM | POA: Diagnosis not present

## 2022-09-22 DIAGNOSIS — Z780 Asymptomatic menopausal state: Secondary | ICD-10-CM | POA: Insufficient documentation

## 2022-09-22 DIAGNOSIS — Z1231 Encounter for screening mammogram for malignant neoplasm of breast: Secondary | ICD-10-CM | POA: Diagnosis not present

## 2022-09-22 DIAGNOSIS — Z13820 Encounter for screening for osteoporosis: Secondary | ICD-10-CM | POA: Diagnosis not present

## 2022-11-10 ENCOUNTER — Ambulatory Visit: Payer: Medicare PPO | Admitting: Dermatology

## 2022-11-15 ENCOUNTER — Ambulatory Visit (INDEPENDENT_AMBULATORY_CARE_PROVIDER_SITE_OTHER): Payer: Medicare PPO

## 2022-11-15 ENCOUNTER — Encounter: Payer: Self-pay | Admitting: Internal Medicine

## 2022-11-15 ENCOUNTER — Ambulatory Visit: Payer: Medicare PPO | Admitting: Pulmonary Disease

## 2022-11-15 ENCOUNTER — Encounter: Payer: Self-pay | Admitting: Pulmonary Disease

## 2022-11-15 VITALS — BP 122/66 | HR 74 | Ht 65.0 in | Wt 170.4 lb

## 2022-11-15 DIAGNOSIS — R059 Cough, unspecified: Secondary | ICD-10-CM

## 2022-11-15 DIAGNOSIS — R062 Wheezing: Secondary | ICD-10-CM

## 2022-11-15 DIAGNOSIS — J309 Allergic rhinitis, unspecified: Secondary | ICD-10-CM

## 2022-11-15 DIAGNOSIS — J9811 Atelectasis: Secondary | ICD-10-CM | POA: Diagnosis not present

## 2022-11-15 LAB — POCT EXHALED NITRIC OXIDE: FeNO level (ppb): 159

## 2022-11-15 MED ORDER — FLUTICASONE FUROATE-VILANTEROL 100-25 MCG/ACT IN AEPB: 1.0000 | INHALATION_SPRAY | Freq: Every day | RESPIRATORY_TRACT | 5 refills | Status: DC

## 2022-11-15 MED ORDER — ALBUTEROL SULFATE HFA 108 (90 BASE) MCG/ACT IN AERS: 2.0000 | INHALATION_SPRAY | Freq: Four times a day (QID) | RESPIRATORY_TRACT | 2 refills | Status: DC | PRN

## 2022-11-15 NOTE — Progress Notes (Signed)
Subjective:    [name], female    DOB: 1955-11-28, 67 y.o.   MRN: [id number]  Synopsis: 67 year old female who had a past history significant for annual episodes of bronchitis presented and early 2015 with severe persistent bronchitis and wheezing. Pulmonary function testing in February 2015 was completely normal as well as a CT scan of the chest. Only very small pulmonary nodules are seen in the right middle lobe. A chest x-ray in March 2015 showed an isolated elevated right hemidiaphragm which is a new finding compared to the February study.  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  HPI  Chief Complaint  Patient presents with   Consult    Former patient of yours. Pt states she is back because she is worried due to the family history of copd. Back in October she was placed on doxy/pred and it flared back up in December. Was told it was post nasal drop. Coughing and wheezing. Producing clear thick mucus at random times. Sneezing and running nose.    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 Her symptoms improved, but then in December she go sick again > she was seen again because she had severe cough, ongoing sneezing > the cough persists daily.  It's productive occasionally, more in the mornings, clear mucus no blood in it.  Mucus was brown in October, now better > the cough has interfered with her social life (gatherings, etc). > she reports not difficulty breathing at all > however she has been feeling better and she didn't feel like exercising > she says that her nose continues to "burn" > talking too much, singing will bring the cough  on > soft drinks will make her cough more > [place] couple of [place].  No  > she uses Flonase: stopped it about 2 weeks ago.    Rarely has heartburn or indigestion.   She's had 2-3 episodes of bronchitis since she saw me the last time.  Typically she'll need antibiotics.  Usually Levaquin does the trick.   She is a lifetime long smoker.      Past Medical History:  Diagnosis Date   Arthritis    Asthma    Basal cell carcinoma 08/01/2017   Left spinal mid lower back. Nodular.   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 Excised 11/26/2009, margins free.     Family History  Problem Relation Age of Onset   COPD Mother    Cancer Mother        breast and colon   Breast cancer Mother 46   Diabetes Paternal Grandfather      Social History   Socioeconomic History   Marital status: Married    Spouse name: Not on file   Number of children: Not on file   Years of education: Not on file   Highest education  level: Not on file  Occupational History   Not on file  Tobacco Use   Smoking status: Never   Smokeless tobacco: Never  Substance and Sexual Activity   Alcohol use: Yes    Comment: glass of wine occasionally   Drug use: No   Sexual activity: Yes    Birth control/protection: None  Other Topics Concern   Not on file  Social History Narrative   Not on file   Social Determinants of Health   Financial Resource Strain: Low Risk  (12/10/2021)   Overall Financial Resource Strain (CARDIA)    Difficulty of Paying Living Expenses: Not hard at all  Food Insecurity: No Food Insecurity (12/10/2021)   Hunger Vital Sign    Worried About Running Out of Food in the Last Year: Never true    Ran Out of Food in the Last Year: Never true   Transportation Needs: No Transportation Needs (12/10/2021)   PRAPARE - Hydrologist (Medical): No    Lack of Transportation (Non-Medical): No  Physical Activity: Insufficiently Active (12/10/2021)   Exercise Vital Sign    Days of Exercise per Week: 3 days    Minutes of Exercise per Session: 30 min  Stress: No Stress Concern Present (12/10/2021)   [place]    Feeling of Stress : Not at all  Social Connections: Moderately Integrated (12/10/2021)   Social Connection and Isolation Panel [NHANES]    Frequency of Communication with Friends and Family: More than three times a week    Frequency of Social Gatherings with Friends and Family: Once a week    Attends Religious Services: 1 to 4 times per year    Active Member of [employer] or Organizations: No    Attends Archivist Meetings: Patient refused    Marital Status: Married  Human resources officer Violence: Not At Risk (12/10/2021)   Humiliation, Afraid, Rape, and Kick questionnaire    Fear of Current or Ex-Partner: No    Emotionally Abused: No    Physically Abused: No    Sexually Abused: No     Allergies  Allergen Reactions   Penicillins     Hives, rash     Outpatient Medications Prior to Visit  Medication Sig Dispense Refill   Calcium Carbonate-Vitamin D (CALTRATE 600+D PO) Take by mouth.     Multiple Vitamins-Minerals (CENTRUM SILVER PO) Take by mouth.     pravastatin (PRAVACHOL) 40 MG tablet Take 1 tablet (40 mg total) by mouth daily. 90 tablet 1   doxycycline (VIBRA-TABS) 100 MG tablet Take 1 tablet (100 mg total) by mouth 2 (two) times daily. 14 tablet 0   fluticasone (FLONASE) 50 MCG/ACT nasal spray Place 2 sprays into both nostrils daily. 16 g 0   levocetirizine (XYZAL) 5 MG tablet Take 1 tablet (5 mg total) by mouth every evening. 30 tablet 0   No facility-administered medications prior to visit.      Review of Systems   Constitutional:  Negative for appetite change, chills, diaphoresis, fatigue and fever.  HENT:  Negative for congestion, hearing loss, nosebleeds, postnasal drip, rhinorrhea, sinus pressure, sore throat and trouble swallowing.   Eyes:  Negative for discharge, redness and visual disturbance.  Respiratory:  Positive for cough and wheezing. Negative for choking, chest tightness and shortness of breath.   Cardiovascular:  Negative for chest pain and leg swelling.  Gastrointestinal:  Negative for abdominal pain, blood in stool, constipation, diarrhea and nausea.  Genitourinary:  Negative for dysuria, frequency and hematuria.  Musculoskeletal:  Negative for arthralgias, joint swelling, myalgias and neck stiffness.  Skin:  Negative for color change, pallor and rash.  Neurological:  Positive for facial asymmetry. Negative for dizziness, seizures, speech difficulty, light-headedness, numbness and headaches.  Hematological:  Negative for adenopathy. Does not bruise/bleed easily.       Objective:   Physical Exam Vitals:   11/15/22 1523  BP: 122/66  Pulse: 74  SpO2: 97%  Weight: 170 lb 6.4 oz (77.3 kg)  Height: 5' 5"$  (1.651 m)   RA  Gen: well appearing, no acute distress HENT: NCAT, OP clear, neck supple without masses Eyes: PERRL, EOMi Lymph: no cervical lymphadenopathy PULM: Significant wheezing bilaterally B, normal air movement CV: RRR, no mgr, no JVD GI: BS+, soft, nontender, no hsm Derm: no rash or skin breakdown MSK: normal bulk and tone Neuro: A&Ox4, CN II-XII intact, strength 5/5 in all 4 extremities Psyche: normal mood and affect  PFT 10/2013 full PFT> ratio 77%, FEV1 2.89L (119% pred), no change with BD, TLC 5.22L (102% pred) DLCO 25.0 (113% pred) 11/2022 FENO > 159 ppb  Imaging: 06/27/2013 CXR [facility] > RLL pneumonia 10/2013 CT Chest> no ILD, few nonspecific pulmonary nodules no bigger than 3 mm in size 2015 SN[facility] test> minimal movement of the right hemidiaphragm March 2015  chest x-ray isolated elevated right hemidiaphragm with right lower lobe atelectasis 09/2022 CXR > no active disease        Assessment & Plan:   Wheezing - Plan: POCT EXHALED NITRIC OXIDE, Pulmonary function test, DG Chest 2 View  Cough, unspecified type - Plan: POCT EXHALED NITRIC OXIDE, Pulmonary function test, DG Chest 2 View  Allergic rhinitis, unspecified seasonality, unspecified trigger  Discussion: Jackie returns to clinic with severe wheezing bilaterally after several months of symptoms.  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  On exam today she has ongoing wheezing which raises concern for asthma exacerbated by a recent virus.  She is concerned because her mother had COPD.  Previously her lung function testing in 2015 did not show evidence of COPD.  I think the most likely problem is asthma recently exacerbated by a virus.  She also likely has ongoing laryngeal irritation which is contributing to the severity of her cough.  I think that postnasal drip is likely contributing as well.  This is likely due to allergic rhinitis.  Plan: Wheezing, likely asthma exacerbated by her viral infection: Chest x-ray Full pulmonary function test Exhaled nitric oxide test Start Breo 100, 1 puff daily no matter how you feel Use albuterol as needed for chest tightness wheezing or shortness of breath (or in your case cough)  Laryngeal irritation leading to perpetual cough: You need to try to suppress your cough to allow your larynx (voice box) to heal.  For three days don't talk, laugh, sing, or clear your throat. Do everything you can to suppress the cough during this time. Use hard candies (sugarless Jolly Ranchers) or non-mint or non-menthol containing cough drops during this time to soothe your throat.  Use a cough suppressant (Delsym or what I have prescribed you) around the clock during this  time.  After three days, gradually increase the use of your voice and back off on the cough suppressants.  Allergic rhinitis: Flonase 2 sprays each nostril daily no matter how you feel Cetirizine 10 mg daily  Follow-up with  me or a nurse practitioner in 7 to 10 days  Immunization History  Administered Date(s) Administered   Fluad Quad(high Dose 65+) 08/16/2019, 07/13/2022   Influenza,inj,Quad PF,6+ Mos 06/13/2014, 10/07/2015, 07/13/2017   Influenza-Unspecified 07/14/2018, 08/26/2020, 08/04/2021   PFIZER(Purple Top)SARS-COV-2 Vaccination 11/10/2019, 12/04/2019   Pneumococcal Conjugate-13 12/27/2013   Pneumococcal Polysaccharide-23 10/26/2018   Zoster Recombinat (Shingrix) 10/21/2018, 03/05/2019      Current Outpatient Medications:    albuterol (VENTOLIN HFA) 108 (90 Base) MCG/ACT inhaler, Inhale 2 puffs into the lungs every 6 (six) hours as needed for wheezing or shortness of breath., Disp: 8 g, Rfl: 2   Calcium Carbonate-Vitamin D (CALTRATE 600+D PO), Take by mouth., Disp: , Rfl:    fluticasone furoate-vilanterol (BREO ELLIPTA) 100-25 MCG/ACT AEPB, Inhale 1 puff into the lungs daily., Disp: 1 each, Rfl: 5   Multiple Vitamins-Minerals (CENTRUM SILVER PO), Take by mouth., Disp: , Rfl:    pravastatin (PRAVACHOL) 40 MG tablet, Take 1 tablet (40 mg total) by mouth daily., Disp: 90 tablet, Rfl: 1

## 2022-11-15 NOTE — Patient Instructions (Signed)
Wheezing, likely asthma exacerbated by her viral infection: Chest x-ray Full pulmonary function test Exhaled nitric oxide test Start Breo 100, 1 puff daily no matter how you feel Use albuterol as needed for chest tightness wheezing or shortness of breath (or in your case cough)  Laryngeal irritation leading to perpetual cough: You need to try to suppress your cough to allow your larynx (voice box) to heal.  For three days don't talk, laugh, sing, or clear your throat. Do everything you can to suppress the cough during this time. Use hard candies (sugarless Jolly Ranchers) or non-mint or non-menthol containing cough drops during this time to soothe your throat.  Use a cough suppressant (Delsym or what I have prescribed you) around the clock during this time.  After three days, gradually increase the use of your voice and back off on the cough suppressants.  Allergic rhinitis: Flonase 2 sprays each nostril daily no matter how you feel Cetirizine 10 mg daily  Follow-up with me or a nurse practitioner in 7 to 10 days

## 2022-11-16 LAB — CBC WITH DIFFERENTIAL/PLATELET
Basophils Absolute: 0 10*3/uL (ref 0.0–0.1)
Basophils Relative: 0.7 % (ref 0.0–3.0)
Eosinophils Absolute: 0.2 10*3/uL (ref 0.0–0.7)
Eosinophils Relative: 4.2 % (ref 0.0–5.0)
HCT: 41.7 % (ref 36.0–46.0)
Hemoglobin: 13.9 g/dL (ref 12.0–15.0)
Lymphocytes Relative: 25.9 % (ref 12.0–46.0)
Lymphs Abs: 1.4 10*3/uL (ref 0.7–4.0)
MCHC: 33.4 g/dL (ref 30.0–36.0)
MCV: 87.6 fl (ref 78.0–100.0)
Monocytes Absolute: 0.4 10*3/uL (ref 0.1–1.0)
Monocytes Relative: 7 % (ref 3.0–12.0)
Neutro Abs: 3.5 10*3/uL (ref 1.4–7.7)
Neutrophils Relative %: 62.2 % (ref 43.0–77.0)
Platelets: 201 10*3/uL (ref 150.0–400.0)
RBC: 4.76 Mil/uL (ref 3.87–5.11)
RDW: 13.4 % (ref 11.5–15.5)
WBC: 5.6 10*3/uL (ref 4.0–10.5)

## 2022-11-16 LAB — IGE: IgE (Immunoglobulin E), Serum: 91 kU/L (ref <=114)

## 2022-11-17 ENCOUNTER — Telehealth: Payer: Self-pay | Admitting: Pulmonary Disease

## 2022-11-17 ENCOUNTER — Ambulatory Visit: Payer: Medicare PPO | Admitting: Dermatology

## 2022-11-17 VITALS — BP 131/77 | HR 63

## 2022-11-17 DIAGNOSIS — Z7189 Other specified counseling: Secondary | ICD-10-CM | POA: Diagnosis not present

## 2022-11-17 DIAGNOSIS — D1801 Hemangioma of skin and subcutaneous tissue: Secondary | ICD-10-CM | POA: Diagnosis not present

## 2022-11-17 DIAGNOSIS — L821 Other seborrheic keratosis: Secondary | ICD-10-CM | POA: Diagnosis not present

## 2022-11-17 DIAGNOSIS — R059 Cough, unspecified: Secondary | ICD-10-CM

## 2022-11-17 DIAGNOSIS — D485 Neoplasm of uncertain behavior of skin: Secondary | ICD-10-CM | POA: Diagnosis not present

## 2022-11-17 DIAGNOSIS — L578 Other skin changes due to chronic exposure to nonionizing radiation: Secondary | ICD-10-CM | POA: Diagnosis not present

## 2022-11-17 DIAGNOSIS — L814 Other melanin hyperpigmentation: Secondary | ICD-10-CM | POA: Diagnosis not present

## 2022-11-17 NOTE — Progress Notes (Signed)
Pt notified of results and she verbalized understanding.

## 2022-11-17 NOTE — Telephone Encounter (Signed)
Patient is calling back because she stated she forgot to ask another question earlier.  Please call patient back to discuss at [phone]

## 2022-11-17 NOTE — Telephone Encounter (Signed)
Spoke with the pt  She was calling about getting her PFT scheduled  She wants this done in [place] at [facility]  I have corrected the order and informed her that she will be called to get this scheduled

## 2022-11-17 NOTE — Progress Notes (Signed)
   Follow-Up Visit   Subjective  Brenda Burns is a 67 y.o. 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   The following portions of the chart were reviewed this encounter and updated as appropriate:       Review of Systems:  No other skin or systemic complaints except as noted in HPI or Assessment and Plan.  Objective  Well appearing patient in no apparent distress; mood and affect are within normal limits.  A focused examination was performed including the face. Relevant physical exam findings are noted in the Assessment and Plan.  R lat brow 1.0 cm light tan macule    Assessment & Plan  Neoplasm of uncertain behavior of skin R lat brow   Lentigo vs early SK - Benign-appearing.  Observation.  Call clinic for new or changing lesions.     Hemangiomas - Red papules - Discussed benign nature - Observe - Call for any changes  Actinic Damage - chronic, secondary to cumulative UV radiation exposure/sun exposure over time - diffuse scaly erythematous macules with underlying dyspigmentation - Recommend daily broad spectrum sunscreen SPF 30+ to sun-exposed areas, reapply every 2 hours as needed.  - Recommend staying in the shade or wearing long sleeves, sun glasses (UVA+UVB protection) and wide brim hats (4-inch brim around the entire circumference of the hat). - Call for new or changing lesions.  Seborrheic Keratoses - Stuck-on, waxy, tan-brown papules and/or plaques  - Benign-appearing - Discussed benign etiology and prognosis. - Observe - Call for any changes  Lentigines - Scattered tan macules - Due to sun exposure - Benign-appearing, observe - Recommend daily broad spectrum sunscreen SPF 30+ to sun-exposed areas, reapply every 2 hours as needed. - Call for any changes  Counseling for BBL / IPL / Laser and Coordination  of Care Discussed the treatment option of Broad Band Light (BBL) /Intense Pulsed Light (IPL)/ Laser for skin discoloration, including brown spots and redness.  Typically we recommend at least 1-3 treatment sessions about 5-8 weeks apart for best results.  Cannot have tanned skin when BBL performed, and regular use of sunscreen is advised after the procedure to help maintain results. The patient's condition may also require "maintenance treatments" in the future.  The fee for BBL / laser treatments is $350 per treatment session for the whole face.  A fee can be quoted for other parts of the body.  Insurance typically does not pay for BBL/laser treatments and therefore the fee is an out-of-pocket cost.  Return for appointment as scheduled.  Luther Redo, CMA, am acting as scribe for [name], MD .  Documentation: I have reviewed the above documentation for accuracy and completeness, and I agree with the above.  [name]

## 2022-11-17 NOTE — Telephone Encounter (Signed)
  C, Please let the patient know this showed some areas of scarring and inflammation in her lungs.  When she comes back to see an NP next week she needs to have a repeat CXR. Thanks, B  Spoke with the pt and notified of results/recs per Dr Lake Bells  She verbalized understanding  Will keep planned appt

## 2022-11-17 NOTE — Patient Instructions (Addendum)
Counseling for BBL / IPL / Laser and Coordination of Care Discussed the treatment option of Broad Band Light (BBL) /Intense Pulsed Light (IPL)/ Laser for skin discoloration, including brown spots and redness.  Typically we recommend at least 1-3 treatment sessions about 5-8 weeks apart for best results.  Cannot have tanned skin when BBL performed, and regular use of sunscreen is advised after the procedure to help maintain results. The patient's condition may also require "maintenance treatments" in the future.  The fee for BBL / laser treatments is $350 per treatment session for the whole face.  A fee can be quoted for other parts of the body.  Insurance typically does not pay for BBL/laser treatments and therefore the fee is an out-of-pocket cost.  Due to recent changes in healthcare laws, you may see results of your pathology and/or laboratory studies on MyChart before the doctors have had a chance to review them. We understand that in some cases there may be results that are confusing or concerning to you. Please understand that not all results are received at the same time and often the doctors may need to interpret multiple results in order to provide you with the best plan of care or course of treatment. Therefore, we ask that you please give [place] 2 business days to thoroughly review all your results before contacting the office for clarification. Should we see a critical lab result, you will be contacted sooner.   If You Need Anything After Your Visit  If you have any questions or concerns for your doctor, please call our main line at [phone] and press option 4 to reach your doctor's medical assistant. If no one answers, please leave a voicemail as directed and we will return your call as soon as possible. Messages left after 4 pm will be answered the following business day.   You may also send [place] a message via [place]. We typically respond to MyChart messages within 1-2 business days.  For  prescription refills, please ask your pharmacy to contact our office. Our fax number is [phone].  If you have an urgent issue when the clinic is closed that cannot wait until the next business day, you can page your doctor at the number below.    Please note that while we do our best to be available for urgent issues outside of office hours, we are not available 24/7.   If you have an urgent issue and are unable to reach [place], you may choose to seek medical care at your doctor's office, retail clinic, urgent care center, or emergency room.  If you have a medical emergency, please immediately call 911 or go to the emergency department.  Pager Numbers  - Dr. Nehemiah Massed: [phone]  - Dr. Laurence Ferrari: [phone]  - Dr. Nicole Kindred: [phone]  In the event of inclement weather, please call our main line at [phone] for an update on the status of any delays or closures.  Dermatology Medication Tips: Please keep the boxes that topical medications come in in order to help keep track of the instructions about where and how to use these. Pharmacies typically print the medication instructions only on the boxes and not directly on the medication tubes.   If your medication is too expensive, please contact our office at [phone] option 4 or send [place] a message through [place].   We are unable to tell what your co-pay for medications will be in advance as this is different depending on your insurance coverage. However, we may  be able to find a substitute medication at lower cost or fill out paperwork to get insurance to cover a needed medication.   If a prior authorization is required to get your medication covered by your insurance company, please allow [place] 1-2 business days to complete this process.  Drug prices often vary depending on where the prescription is filled and some pharmacies may offer cheaper prices.  The website www.goodrx.com contains coupons for medications through different  pharmacies. The prices here do not account for what the cost may be with help from insurance (it may be cheaper with your insurance), but the website can give you the price if you did not use any insurance.  - You can print the associated coupon and take it with your prescription to the pharmacy.  - You may also stop by our office during regular business hours and pick up a GoodRx coupon card.  - If you need your prescription sent electronically to a different pharmacy, notify our office through [facility] or by phone at [phone] option 4.     Si Usted Necesita Algo Despus de Su Visita  Tambin puede enviarnos un mensaje a travs de Pharmacist, community. Por lo general respondemos a los mensajes de MyChart en el transcurso de 1 a 2 das hbiles.  Para renovar recetas, por favor pida a su farmacia que se ponga en contacto con nuestra oficina. Harland Dingwall de fax es [place] [phone].  Si tiene un asunto urgente cuando la clnica est cerrada y que no puede esperar hasta el siguiente da hbil, puede llamar/localizar a su doctor(a) al nmero que aparece a continuacin.   Por favor, tenga en cuenta que aunque hacemos todo lo posible para estar disponibles para asuntos urgentes fuera del horario de [place], no estamos disponibles las 24 horas del da, los 7 das de la [place].   Si tiene un problema urgente y no puede comunicarse con nosotros, puede optar por buscar atencin mdica  en el consultorio de su doctor(a), en una clnica privada, en un centro de atencin urgente o en una sala de emergencias.  Si tiene Engineering geologist, por favor llame inmediatamente al 911 o vaya a la sala de emergencias.  Nmeros de bper  - Dr. Nehemiah Massed: [phone]  - Dra. Moye: [phone]  - Dra. Nicole Kindred: [phone]  En caso de inclemencias del [place], por favor llame a Johnsie Kindred principal al [phone] para una actualizacin sobre el [place] de cualquier retraso o cierre.  Consejos para la  medicacin en dermatologa: Por favor, guarde las cajas en las que vienen los medicamentos de uso tpico para ayudarle a seguir las instrucciones sobre dnde y cmo usarlos. Las farmacias generalmente imprimen las instrucciones del medicamento slo en las cajas y no directamente en los tubos del [employer].   Si su medicamento es muy caro, por favor, pngase en contacto con Zigmund Daniel llamando al [phone] y presione la opcin 4 o envenos un mensaje a travs de Pharmacist, community.   No podemos decirle cul ser su copago por los medicamentos por adelantado ya que esto es diferente dependiendo de la cobertura de su seguro. Sin embargo, es posible que podamos encontrar un medicamento sustituto a Electrical engineer un formulario para que el seguro cubra el medicamento que se considera necesario.   Si se requiere una autorizacin previa para que su compaa de seguros [place] su medicamento, por favor permtanos de 1 a 2 das hbiles para completar este proceso.  Los precios de los medicamentos varan  con frecuencia dependiendo del lugar de dnde se surte la receta y alguna farmacias pueden ofrecer precios ms baratos.  El sitio web www.goodrx.com tiene cupones para medicamentos de Airline pilot. Los precios aqu no tienen en cuenta lo que podra costar con la ayuda del seguro (puede ser ms barato con su seguro), pero el sitio web puede darle el precio si no utiliz Research scientist (physical sciences).  - Puede imprimir el cupn correspondiente y llevarlo con su receta a la farmacia.  - Tambin puede pasar por nuestra oficina durante el horario de atencin regular y Charity fundraiser una tarjeta de cupones de GoodRx.  - Si necesita que su receta se enve electrnicamente a una farmacia diferente, informe a nuestra oficina a travs de MyChart de [place] o por telfono llamando al [phone] y presione la opcin 4.

## 2022-11-17 NOTE — Addendum Note (Signed)
Addended by: Rosana Berger on: 11/17/2022 11:46 AM   Modules accepted: Orders

## 2022-11-18 NOTE — Telephone Encounter (Signed)
Called and spoke with patient. Patient stated that she wouldn't be able to have the PFT done before her appointment on March 1st with Dr. Lake Bells. Patient wanted to know if she should keep her appointment on March 1st or if she should reschedule.   DM, please advise.

## 2022-11-19 ENCOUNTER — Other Ambulatory Visit: Payer: Self-pay | Admitting: Nurse Practitioner

## 2022-11-19 ENCOUNTER — Other Ambulatory Visit: Payer: Self-pay | Admitting: Pulmonary Disease

## 2022-11-19 ENCOUNTER — Encounter: Payer: Self-pay | Admitting: Pulmonary Disease

## 2022-11-19 DIAGNOSIS — R0982 Postnasal drip: Secondary | ICD-10-CM

## 2022-11-19 DIAGNOSIS — R051 Acute cough: Secondary | ICD-10-CM

## 2022-11-19 MED ORDER — FLUTICASONE PROPIONATE 50 MCG/ACT NA SUSP: 2.0000 | Freq: Every day | NASAL | 2 refills | Status: DC

## 2022-11-21 ENCOUNTER — Encounter: Payer: Self-pay | Admitting: Pulmonary Disease

## 2022-11-22 MED ORDER — AZITHROMYCIN 250 MG PO TABS: ORAL_TABLET | ORAL | 0 refills | Status: DC

## 2022-11-22 NOTE — Telephone Encounter (Signed)
Mychart message sent by pt stating that she began coughing up discolored phlegm which began 1 day ago. Pt is not having any complaints of wheezing and no fever. Has been taking an antihistamine.   Pt said that she is about to leave this afternoon to go out of town and is wanting to know if she could have an abx prescribed due to the fact that she has begun coughing up discolored phlegm and since she is going out of town, that way things don't get any worse.    [name], please advise.

## 2022-11-22 NOTE — Telephone Encounter (Signed)
I will send in zpack. Continue Breo.  Use Albuterol 2 puffs every 4-6 hours as needed for shortness of breath/wheezing

## 2022-11-24 ENCOUNTER — Encounter: Payer: Self-pay | Admitting: Pulmonary Disease

## 2022-11-24 NOTE — Telephone Encounter (Signed)
I would tell patient to go to local urgent care, this can not be managed via telephone. We can not send prescription [place] of the [place]. Would recommend she take mucinex or robitussin for cough. If having nasal congestion in addition to cough would try over the counter antihistamine such as zyrtec or Claritin and a nasal spay like flonase (fluticasone)

## 2022-11-24 NOTE — Telephone Encounter (Signed)
Patient called and informed of this information from the pharmacy. Patient will try to find another pharmacy that can take a verbal or maybe try fax? Patient is spitting up discolored mucus- please advise.

## 2022-11-24 NOTE — Telephone Encounter (Signed)
Patient was not able to get the abx prior to leaving on her trip. Pt is currently in the [place] and I tried calling [facility] to see if they would take a verbal of the zpak abx but they would not.  We are waiting to hear from pt of a different pharmacy that accepts either electronic prescriptions or faxed prescriptions so she can be able to get the abx while she is out of the country.  In the mean time, are there any recommendations for her of some OTC remedies that she might be able to do since she is coughing up discolored phlegm.   [name], please advise on this.

## 2022-11-26 ENCOUNTER — Telehealth: Payer: Self-pay | Admitting: Internal Medicine

## 2022-11-26 NOTE — Telephone Encounter (Signed)
Called patient to schedule Medicare Annual Wellness Visit (AWV). Left message for patient to call back and schedule Medicare Annual Wellness Visit (AWV).  Last date of AWV: AWVI eligible as of 07/04/2020  Please schedule an appointment at any time with Denisa, [place].  If any questions, please contact me at [phone].   Thank you,  [place] Direct dial  [phone]

## 2022-12-03 ENCOUNTER — Ambulatory Visit: Payer: Medicare PPO | Admitting: Pulmonary Disease

## 2022-12-07 ENCOUNTER — Ambulatory Visit: Payer: Medicare PPO | Attending: Internal Medicine

## 2022-12-07 DIAGNOSIS — R062 Wheezing: Secondary | ICD-10-CM | POA: Insufficient documentation

## 2022-12-07 DIAGNOSIS — R059 Cough, unspecified: Secondary | ICD-10-CM | POA: Diagnosis not present

## 2022-12-07 LAB — PULMONARY FUNCTION TEST ARMC ONLY
DL/VA % pred: 115 %
DL/VA: 4.75 ml/min/mmHg/L
DLCO unc % pred: 122 %
DLCO unc: 24.8 ml/min/mmHg
FEF 25-75 Post: 3.13 L/sec
FEF 25-75 Pre: 3.37 L/sec
FEF2575-%Change-Post: -7 %
FEF2575-%Pred-Post: 152 %
FEF2575-%Pred-Pre: 164 %
FEV1-%Change-Post: 0 %
FEV1-%Pred-Post: 114 %
FEV1-%Pred-Pre: 115 %
FEV1-Post: 2.79 L
FEV1-Pre: 2.79 L
FEV1FVC-%Change-Post: 0 %
FEV1FVC-%Pred-Pre: 111 %
FEV6-%Change-Post: -1 %
FEV6-%Pred-Post: 106 %
FEV6-%Pred-Pre: 107 %
FEV6-Post: 3.25 L
FEV6-Pre: 3.29 L
FEV6FVC-%Pred-Post: 104 %
FEV6FVC-%Pred-Pre: 104 %
FVC-%Change-Post: -1 %
FVC-%Pred-Post: 102 %
FVC-%Pred-Pre: 103 %
FVC-Post: 3.26 L
FVC-Pre: 3.29 L
Post FEV1/FVC ratio: 85 %
Post FEV6/FVC ratio: 100 %
Pre FEV1/FVC ratio: 85 %
Pre FEV6/FVC Ratio: 100 %
RV % pred: 112 %
RV: 2.47 L
TLC % pred: 111 %
TLC: 5.78 L

## 2022-12-07 MED ORDER — ALBUTEROL SULFATE (2.5 MG/3ML) 0.083% IN NEBU
2.5000 mg | INHALATION_SOLUTION | Freq: Once | RESPIRATORY_TRACT | Status: AC
  Administered 2022-12-07: 2.5 mg via RESPIRATORY_TRACT
  Filled 2022-12-07: qty 3

## 2022-12-08 NOTE — Telephone Encounter (Signed)
Patient completed her PFT at [facility]. Will close encounter.

## 2022-12-09 ENCOUNTER — Telehealth: Payer: Self-pay

## 2022-12-09 NOTE — Telephone Encounter (Signed)
Discussed pt's upcoming BBL appointment on 12/20/22 and prophylactic Valtrex.  Pt does not have any history of fever blisters and did not want to start Valtrex./sh

## 2022-12-10 ENCOUNTER — Ambulatory Visit (INDEPENDENT_AMBULATORY_CARE_PROVIDER_SITE_OTHER): Payer: Medicare PPO

## 2022-12-10 VITALS — Ht 65.0 in | Wt 170.0 lb

## 2022-12-10 DIAGNOSIS — Z0000 Encounter for general adult medical examination without abnormal findings: Secondary | ICD-10-CM

## 2022-12-10 NOTE — Addendum Note (Signed)
Addended by: Leta Jungling on: 12/10/2022 09:52 AM   Modules accepted: Orders, Level of Service

## 2022-12-10 NOTE — Patient Instructions (Addendum)
Ms. Brenda Burns , Thank you for taking time to come for your Medicare Wellness Visit. I appreciate your ongoing commitment to your health goals. Please review the following plan we discussed and let me know if I can assist you in the future.   These are the goals we discussed:  Goals       Patient Stated     Weight (lb) < 170 lb (77.1 kg) (pt-stated)      Portion control meals Stay active Stay hydrated        This is a list of the screening recommended for you and due dates:  Health Maintenance  Topic Date Due   DTaP/Tdap/Td vaccine (1 - Tdap) Never done   COVID-19 Vaccine (3 - Pfizer risk series) 12/26/2022*   Pneumonia Vaccine (3 of 3 - PPSV23 or PCV20) 10/27/2023   Medicare Annual Wellness Visit  12/10/2023   Mammogram  09/22/2024   Colon Cancer Screening  09/17/2031   Flu Shot  Completed   DEXA scan (bone density measurement)  Completed   Hepatitis C Screening: USPSTF Recommendation to screen - Ages 18-79 yo.  Completed   Zoster (Shingles) Vaccine  Completed   HPV Vaccine  Aged Out  *Topic was postponed. The date shown is not the original due date.   Advanced directives: End of life planning; Advance aging; Advanced directives discussed.  Copy of current HCPOA/Living Will requested.    Conditions/risks identified: none new.  Next appointment: Follow up in one year for your annual wellness visit    Preventive Care 65 Years and Older, Female Preventive care refers to lifestyle choices and visits with your health care provider that can promote health and wellness. What does preventive care include? A yearly physical exam. This is also called an annual well check. Dental exams once or twice a year. Routine eye exams. Ask your health care provider how often you should have your eyes checked. Personal lifestyle choices, including: Daily care of your teeth and gums. Regular physical activity. Eating a healthy diet. Avoiding tobacco and drug use. Limiting alcohol  use. Practicing safe sex. Taking low-dose aspirin every day. Taking vitamin and mineral supplements as recommended by your health care provider. What happens during an annual well check? The services and screenings done by your health care provider during your annual well check will depend on your age, overall health, lifestyle risk factors, and family history of disease. Counseling  Your health care provider may ask you questions about your: Alcohol use. Tobacco use. Drug use. Emotional well-being. Home and relationship well-being. Sexual activity. Eating habits. History of falls. Memory and ability to understand (cognition). Work and work Statistician. Reproductive health. Screening  You may have the following tests or measurements: Height, weight, and BMI. Blood pressure. Lipid and cholesterol levels. These may be checked every 5 years, or more frequently if you are over 32 years old. Skin check. Lung cancer screening. You may have this screening every year starting at age 3 if you have a 30-pack-year history of smoking and currently smoke or have quit within the past 15 years. Fecal occult blood test (FOBT) of the stool. You may have this test every year starting at age 6. Flexible sigmoidoscopy or colonoscopy. You may have a sigmoidoscopy every 5 years or a colonoscopy every 10 years starting at age 74. Hepatitis C blood test. Hepatitis B blood test. Sexually transmitted disease (STD) testing. Diabetes screening. This is done by checking your blood sugar (glucose) after you have not eaten for a  while (fasting). You may have this done every 1-3 years. Bone density scan. This is done to screen for osteoporosis. You may have this done starting at age 48. Mammogram. This may be done every 1-2 years. Talk to your health care provider about how often you should have regular mammograms. Talk with your health care provider about your test results, treatment options, and if necessary,  the need for more tests. Vaccines  Your health care provider may recommend certain vaccines, such as: Influenza vaccine. This is recommended every year. Tetanus, diphtheria, and acellular pertussis (Tdap, Td) vaccine. You may need a Td booster every 10 years. Zoster vaccine. You may need this after age 72. Pneumococcal 13-valent conjugate (PCV13) vaccine. One dose is recommended after age 75. Pneumococcal polysaccharide (PPSV23) vaccine. One dose is recommended after age 49. Talk to your health care provider about which screenings and vaccines you need and how often you need them. This information is not intended to replace advice given to you by your health care provider. Make sure you discuss any questions you have with your health care provider. Document Released: 10/17/2015 Document Revised: 06/09/2016 Document Reviewed: 07/22/2015 Elsevier Interactive Patient Education  2017 [place] Prevention in the Home Falls can cause injuries. They can happen to people of all ages. There are many things you can do to make your home safe and to help prevent falls. What can I do on the outside of my home? Regularly fix the edges of walkways and driveways and fix any cracks. Remove anything that might make you trip as you walk through a door, such as a raised step or threshold. Trim any bushes or trees on the path to your home. Use bright outdoor lighting. Clear any walking paths of anything that might make someone trip, such as rocks or tools. Regularly check to see if handrails are loose or broken. Make sure that both sides of any steps have handrails. Any raised decks and porches should have guardrails on the edges. Have any leaves, snow, or ice cleared regularly. Use sand or salt on walking paths during winter. Clean up any spills in your garage right away. This includes oil or grease spills. What can I do in the bathroom? Use night lights. Install grab bars by the toilet and in the  tub and shower. Do not use towel bars as grab bars. Use non-skid mats or decals in the tub or shower. If you need to sit down in the shower, use a plastic, non-slip stool. Keep the floor dry. Clean up any water that spills on the floor as soon as it happens. Remove soap buildup in the tub or shower regularly. Attach bath mats securely with double-sided non-slip rug tape. Do not have throw rugs and other things on the floor that can make you trip. What can I do in the bedroom? Use night lights. Make sure that you have a light by your bed that is easy to reach. Do not use any sheets or blankets that are too big for your bed. They should not hang down onto the floor. Have a firm chair that has side arms. You can use this for support while you get dressed. Do not have throw rugs and other things on the floor that can make you trip. What can I do in the kitchen? Clean up any spills right away. Avoid walking on wet floors. Keep items that you use a lot in easy-to-reach places. If you need to reach something above you,  use a strong step stool that has a grab bar. Keep electrical cords out of the way. Do not use floor polish or wax that makes floors slippery. If you must use wax, use non-skid floor wax. Do not have throw rugs and other things on the floor that can make you trip. What can I do with my stairs? Do not leave any items on the stairs. Make sure that there are handrails on both sides of the stairs and use them. Fix handrails that are broken or loose. Make sure that handrails are as long as the stairways. Check any carpeting to make sure that it is firmly attached to the stairs. Fix any carpet that is loose or worn. Avoid having throw rugs at the top or bottom of the stairs. If you do have throw rugs, attach them to the floor with carpet tape. Make sure that you have a light switch at the top of the stairs and the bottom of the stairs. If you do not have them, ask someone to add them for  you. What else can I do to help prevent falls? Wear shoes that: Do not have high heels. Have rubber bottoms. Are comfortable and fit you well. Are closed at the toe. Do not wear sandals. If you use a stepladder: Make sure that it is fully opened. Do not climb a closed stepladder. Make sure that both sides of the stepladder are locked into place. Ask someone to hold it for you, if possible. Clearly mark and make sure that you can see: Any grab bars or handrails. First and last steps. Where the edge of each step is. Use tools that help you move around (mobility aids) if they are needed. These include: Canes. Walkers. Scooters. Crutches. Turn on the lights when you go into a dark area. Replace any light bulbs as soon as they burn out. Set up your furniture so you have a clear path. Avoid moving your furniture around. If any of your floors are uneven, fix them. If there are any pets around you, be aware of where they are. Review your medicines with your doctor. Some medicines can make you feel dizzy. This can increase your chance of falling. Ask your doctor what other things that you can do to help prevent falls. This information is not intended to replace advice given to you by your health care provider. Make sure you discuss any questions you have with your health care provider. Document Released: 07/17/2009 Document Revised: 02/26/2016 Document Reviewed: 10/25/2014 Elsevier Interactive Patient Education  2017 [employer].

## 2022-12-10 NOTE — Progress Notes (Signed)
Subjective:   Brenda Burns is a 67 y.o. female who presents for an Initial Medicare Annual Wellness Visit.  Review of Systems    No ROS.  Medicare Wellness Virtual Visit.  Visual/audio telehealth visit, UTA vital signs.   See social history for additional risk factors.   Cardiac Risk Factors include: advanced age (>92mn, >>69women)     Objective:    Today's Vitals   12/10/22 0918  Weight: 170 lb (77.1 kg)  Height: '5\' 5"'$  (1.651 m)   Body mass index is 28.29 kg/m.     12/10/2022    9:29 AM 12/10/2021    3:42 PM 12/10/2021    3:20 PM 06/04/2019    6:01 PM  Advanced Directives  Does Patient Have a Medical Advance Directive? Yes Yes Yes No  Type of AParamed[place]of [place]Living will [place] will [place] will   Does patient want to make changes to medical advance directive? No - Patient declined No - Patient declined Yes (ED - Information included in AVS)   Copy of [place] Chart? No - copy requested Yes - validated most recent copy scanned in chart (See row information) Yes - validated most recent copy scanned in chart (See row information)     Current Medications (verified) Outpatient Encounter Medications as of 12/10/2022  Medication Sig   albuterol (VENTOLIN HFA) 108 (90 Base) MCG/ACT inhaler Inhale 2 puffs into the lungs every 6 (six) hours as needed for wheezing or shortness of breath.   Calcium Carbonate-Vitamin D (CALTRATE 600+D PO) Take by mouth.   fluticasone (FLONASE) 50 MCG/ACT nasal spray PLACE 2 SPRAYS INTO BOTH NOSTRILS DAILY.   fluticasone (FLONASE) 50 MCG/ACT nasal spray Place 2 sprays into both nostrils daily.   fluticasone furoate-vilanterol (BREO ELLIPTA) 100-25 MCG/ACT AEPB Inhale 1 puff into the lungs daily.   Multiple Vitamins-Minerals (CENTRUM SILVER PO) Take by mouth.   pravastatin (PRAVACHOL) 40 MG tablet Take 1 tablet (40 mg total) by mouth daily.    [DISCONTINUED] azithromycin (ZITHROMAX) 250 MG tablet Take zpack per instructions   No facility-administered encounter medications on file as of 12/10/2022.    Allergies (verified) Penicillins   History: Past Medical History:  Diagnosis Date   Arthritis    Asthma    Basal cell carcinoma 08/01/2017   Left spinal mid lower back. Nodular.   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 Excised 11/26/2009, margins free.   Past Surgical History:  Procedure Laterality Date   ABDOMINAL HYSTERECTOMY  2004   endometriosis   TONSILLECTOMY  1060's   Family History  Problem Relation Age of Onset   COPD Mother    Cancer Mother        breast and colon   Breast cancer Mother 513  Diabetes Paternal Grandfather    Social History   Socioeconomic History   Marital status: Married    Spouse name: Not on file   Number of children: Not on file   Years of education: Not on file   Highest education level: Not on file  Occupational History   Not on file  Tobacco Use   Smoking status: Never   Smokeless tobacco: Never  Substance and Sexual Activity  Alcohol use: Yes    Comment: glass of wine occasionally   Drug use: No   Sexual activity: Yes    Birth control/protection: None  Other Topics Concern   Not on file  Social History Narrative   Not on file   Social Determinants of Health   Financial Resource Strain: Low Risk  (12/10/2022)   Overall Financial Resource Strain (CARDIA)    Difficulty of Paying Living Expenses: Not hard at all  Food Insecurity: No Food Insecurity (12/10/2022)   Hunger Vital Sign    Worried About Running Out of Food in the Last Year: Never true    Ran Out of Food in the Last Year: Never true  Transportation Needs: No Transportation Needs (12/10/2022)   PRAPARE - Armed forces logistics/support/administrative officer (Medical): No    Lack of Transportation (Non-Medical): No  Physical Activity: Insufficiently Active (12/10/2022)   Exercise Vital Sign    Days of Exercise per Week: 3 days    Minutes of Exercise per Session: 30 min  Stress: No Stress Concern Present (12/10/2022)   [place]    Feeling of Stress : Not at all  Social Connections: Moderately Integrated (12/10/2022)   Social Connection and Isolation Panel [NHANES]    Frequency of Communication with Friends and Family: More than three times a week    Frequency of Social Gatherings with Friends and Family: Once a week    Attends Religious Services: 1 to 4 times per year    Active Member of [employer] or Organizations: No    Attends Music therapist: Patient refused    Marital Status: Married    Tobacco Counseling Counseling given: Not Answered   Clinical Intake:  Pre-visit preparation completed: Yes        Diabetes: No  How often do you need to have someone help you when you read instructions, pamphlets, or other written materials from your doctor or pharmacy?: 1 - Never    Interpreter Needed?: No      Activities of Daily Living    12/10/2022    9:31 AM  In your present state of health, do you have any difficulty performing the following activities:  Hearing? 0  Vision? 0  Difficulty concentrating or making decisions? 0  Walking or climbing stairs? 0  Dressing or bathing? 0  Doing errands, shopping? 0  Preparing Food and eating ? N  Using the Toilet? N  In the past six months, have you accidently leaked urine? N  Do you have problems with loss of bowel control? N  Managing your Medications? N  Managing your Finances? N  Housekeeping or managing your Housekeeping? N    Patient Care Team: Einar Pheasant, MD as PCP - General (Internal Medicine)  Indicate any recent Medical Services you may have received from other than  [facility] providers in the past year (date may be approximate).     Assessment:   This is a routine wellness examination for Brenda Burns.  I connected with  Brenda Burns on 12/10/22 by a audio enabled telemedicine application and verified that I am speaking with the correct person using two identifiers.  Patient Location: Home  Provider Location: Office/Clinic  I discussed the limitations of evaluation and management by telemedicine. The patient expressed understanding and agreed to proceed.   Hearing/Vision screen Hearing Screening - Comments:: Patient is able to hear conversational tones without difficulty.  No issues reported.   Vision Screening -  Comments:: Followed by Dr. Gloriann Loan Wears corrective lenses They have seen their ophthalmologist in the last 12 months.    Dietary issues and exercise activities discussed: Current Exercise Habits: Home exercise routine, Type of exercise: walking, Time (Minutes): 30, Frequency (Times/Week): 3, Weekly Exercise (Minutes/Week): 90, Intensity: Mild   Goals Addressed               This Visit's Progress     Patient Stated     Weight (lb) < 170 lb (77.1 kg) (pt-stated)   170 lb (77.1 kg)     Portion control meals Stay active Stay hydrated       Depression Screen    12/10/2022    9:27 AM 07/13/2022   10:33 AM 12/10/2021    3:43 PM 12/10/2021    3:22 PM 04/23/2021    9:51 AM 07/13/2017   11:48 AM  PHQ 2/9 Scores  PHQ - 2 Score 0 0 0 0 0 0    Fall Risk    12/10/2022    9:31 AM 07/13/2022   10:33 AM 12/10/2021    3:43 PM 12/10/2021    3:22 PM 04/23/2021    9:51 AM  [place] in the past year? 0 0  0 0  Number falls in past yr: 0  0 0 0  Injury with Fall? 0  0 0 0  Risk for fall due to :  No Fall Risks No Fall Risks No Fall Risks   Follow up Falls evaluation completed;Falls prevention discussed Falls evaluation completed Falls evaluation completed Falls evaluation completed     FALL RISK PREVENTION PERTAINING TO THE HOME: Home  free of loose throw rugs in walkways, pet beds, electrical cords, etc? Yes  Adequate lighting in your home to reduce risk of falls? Yes   ASSISTIVE DEVICES UTILIZED TO PREVENT FALLS: Life alert? No  Use of a cane, walker or w/c? No  Grab bars in the bathroom? No  Shower chair or bench in shower? No  Elevated toilet seat or a handicapped toilet? No   TIMED UP AND GO: Was the test performed? No .   Cognitive Function:        12/10/2022    9:33 AM 12/10/2021    3:25 PM  6CIT Screen  What Year? 0 points 0 points  What month? 0 points 0 points  What time? 0 points 0 points  Count back from 20 0 points 0 points  Months in reverse 0 points 0 points  Repeat phrase 0 points 0 points  Total Score 0 points 0 points    Immunizations Immunization History  Administered Date(s) Administered   Fluad Quad(high Dose 65+) 08/16/2019, 07/13/2022   Influenza,inj,Quad PF,6+ Mos 06/13/2014, 10/07/2015, 07/13/2017   Influenza-Unspecified 07/14/2018, 08/26/2020, 08/04/2021   PFIZER(Purple Top)SARS-COV-2 Vaccination 11/10/2019, 12/04/2019   Pneumococcal Conjugate-13 12/27/2013   Pneumococcal Polysaccharide-23 10/26/2018   Zoster Recombinat (Shingrix) 10/21/2018, 03/05/2019   TDAP status: Due, Education has been provided regarding the importance of this vaccine. Advised may receive this vaccine at local pharmacy or Health Dept. Aware to provide a copy of the vaccination record if obtained from local pharmacy or Health Dept. Verbalized acceptance and understanding.  Covid-19 vaccine status: Completed vaccines x2.   Screening Tests Health Maintenance  Topic Date Due   DTaP/Tdap/Td (1 - Tdap) Never done   COVID-19 Vaccine (3 - Pfizer risk series) 12/26/2022 (Originally 01/01/2020)   Pneumonia Vaccine 80+ Years old (3 of 3 - PPSV23 or PCV20) 10/27/2023  Medicare Annual Wellness (AWV)  12/10/2023   MAMMOGRAM  09/22/2024   COLONOSCOPY (Pts 45-29yr Insurance coverage will need to be confirmed)   09/17/2031   INFLUENZA VACCINE  Completed   DEXA SCAN  Completed   Hepatitis C Screening  Completed   Zoster Vaccines- Shingrix  Completed   HPV VACCINES  Aged Out    Health Maintenance Health Maintenance Due  Topic Date Due   DTaP/Tdap/Td (1 - Tdap) Never done    Lung Cancer Screening: (Low Dose CT Chest recommended if Age 69-80years, 30 pack-year currently smoking OR have quit w/in 15years.) does not qualify.   Hepatitis C Screening: Completed 08/2019.  Vision Screening: Recommended annual ophthalmology exams for early detection of glaucoma and other disorders of the eye.  Dental Screening: Recommended annual dental exams for proper oral hygiene.  Community Resource Referral / Chronic Care Management: CRR required this visit?  No   CCM required this visit?  No      Plan:    I have personally reviewed and noted the following in the patient's chart:   Medical and social history Use of alcohol, tobacco or illicit drugs  Current medications and supplements including opioid prescriptions. Patient is not currently taking opioid prescriptions. Functional ability and status Nutritional status Physical activity Advanced directives List of other physicians Hospitalizations, surgeries, and ER visits in previous 12 months Vitals Screenings to include cognitive, depression, and falls Referrals and appointments  In addition, I have reviewed and discussed with patient certain preventive protocols, quality metrics, and best practice recommendations. A written personalized care plan for preventive services as well as general preventive health recommendations were provided to patient.     DLeta Jungling LPN   [id number]  Nurse notes: [place] with worsening flare up of bronchitis.  Given levaquin and issues resolved.  Next scheduled pulmonary appointment 12/16/22.  See chart review for recent PFT.

## 2022-12-13 ENCOUNTER — Ambulatory Visit: Payer: Medicare PPO | Admitting: Pulmonary Disease

## 2022-12-15 NOTE — Progress Notes (Signed)
Synopsis: Referred for cough, dyspnea by Einar Pheasant, MD  Subjective:   [name] GENDER: female DOB: April 09, 1956, MRN: [id number]  No chief complaint on file.  67yF with history of suspected, asthma, [place] previously seen by Dr. [place] Bells  At last visit 2/12 started breo 100, albuterol, FENO, cough rest, flonase, zyrtec  PFT  ***Check CT Chest  Otherwise pertinent review of systems is negative.  Past Medical History:  Diagnosis Date   Arthritis    Asthma    Basal cell carcinoma 08/01/2017   Left spinal mid lower back. Nodular.   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 Excised 11/26/2009, margins free.     Family History  Problem Relation Age of Onset   COPD Mother    Cancer Mother        breast and colon   Breast cancer Mother 24   Diabetes Paternal Grandfather      Past Surgical History:  Procedure Laterality Date   ABDOMINAL HYSTERECTOMY  2004   endometriosis   TONSILLECTOMY  1060's    Social History   Socioeconomic History   Marital status: Married    Spouse name: Not on file   Number of children: Not on file   Years of education: Not on file   Highest education level: Not on file  Occupational History   Not on file  Tobacco Use   Smoking status: Never   Smokeless tobacco: Never  Substance and Sexual Activity   Alcohol use: Yes    Comment: glass of wine occasionally   Drug use: No   Sexual activity: Yes    Birth control/protection: None  Other Topics Concern   Not on file  Social History Narrative   Not on file   Social Determinants of Health   Financial Resource Strain: Low Risk  (12/10/2022)   Overall Financial Resource Strain (CARDIA)    Difficulty of Paying Living Expenses: Not hard at all  Food Insecurity: No Food Insecurity  (12/10/2022)   Hunger Vital Sign    Worried About Running Out of Food in the Last Year: Never true    Ran Out of Food in the Last Year: Never true  Transportation Needs: No Transportation Needs (12/10/2022)   PRAPARE - Hydrologist (Medical): No    Lack of Transportation (Non-Medical): No  Physical Activity: Insufficiently Active (12/10/2022)   Exercise Vital Sign    Days of Exercise per Week: 3 days    Minutes of Exercise per Session: 30 min  Stress: No Stress Concern Present (12/10/2022)   [place]    Feeling of Stress : Not at all  Social Connections: Moderately Integrated (12/10/2022)   Social Connection and Isolation Panel [NHANES]    Frequency of Communication with Friends and Family: More than three times a week    Frequency of Social Gatherings with Friends and Family: Once a week    Attends Religious Services: 1 to 4 times per year    Active Member of [employer] or Organizations: No    Attends Archivist Meetings: Patient refused    Marital Status: Married  Human resources officer Violence: Not At Risk (12/10/2022)   Humiliation, Afraid, Rape, and Kick questionnaire  Fear of Current or Ex-Partner: No    Emotionally Abused: No    Physically Abused: No    Sexually Abused: No     Allergies  Allergen Reactions   Penicillins     Hives, rash     Outpatient Medications Prior to Visit  Medication Sig Dispense Refill   albuterol (VENTOLIN HFA) 108 (90 Base) MCG/ACT inhaler Inhale 2 puffs into the lungs every 6 (six) hours as needed for wheezing or shortness of breath. 8 g 2   Calcium Carbonate-Vitamin D (CALTRATE 600+D PO) Take by mouth.     fluticasone (FLONASE) 50 MCG/ACT nasal spray PLACE 2 SPRAYS INTO BOTH NOSTRILS DAILY. 16 g 5   fluticasone (FLONASE) 50 MCG/ACT nasal spray Place 2 sprays into both nostrils daily. 16 g 2   fluticasone furoate-vilanterol (BREO ELLIPTA) 100-25 MCG/ACT  AEPB Inhale 1 puff into the lungs daily. 1 each 5   Multiple Vitamins-Minerals (CENTRUM SILVER PO) Take by mouth.     pravastatin (PRAVACHOL) 40 MG tablet Take 1 tablet (40 mg total) by mouth daily. 90 tablet 1   No facility-administered medications prior to visit.       Objective:   Physical Exam:  General appearance: 67 y.o., female, NAD, conversant  Eyes: anicteric sclerae; PERRL, tracking appropriately HENT: NCAT; MMM Neck: Trachea midline; no lymphadenopathy, no JVD Lungs: CTAB, no crackles, no wheeze, with normal respiratory effort CV: RRR, no murmur  Abdomen: Soft, non-tender; non-distended, BS present  Extremities: No peripheral edema, warm Skin: Normal turgor and texture; no rash Psych: Appropriate affect Neuro: Alert and oriented to person and place, no focal deficit     There were no vitals filed for this visit.   on *** LPM *** RA BMI Readings from Last 3 Encounters:  12/10/22 28.29 kg/m  11/15/22 28.36 kg/m  09/09/22 27.98 kg/m   Wt Readings from Last 3 Encounters:  12/10/22 170 lb (77.1 kg)  11/15/22 170 lb 6.4 oz (77.3 kg)  09/09/22 163 lb (73.9 kg)     CBC    Component Value Date/Time   WBC 5.6 11/15/2022 1617   RBC 4.76 11/15/2022 1617   HGB 13.9 11/15/2022 1617   HGB 12.2 04/29/2014 0555   HCT 41.7 11/15/2022 1617   HCT 36.3 04/29/2014 0555   PLT 201.0 11/15/2022 1617   PLT 129 (L) 04/29/2014 0555   MCV 87.6 11/15/2022 1617   MCV 87 04/29/2014 0555   MCH 28.8 11/14/2017 0837   MCHC 33.4 11/15/2022 1617   RDW 13.4 11/15/2022 1617   RDW 13.2 04/29/2014 0555   LYMPHSABS 1.4 11/15/2022 1617   LYMPHSABS 1.5 04/29/2014 0555   MONOABS 0.4 11/15/2022 1617   MONOABS 0.7 04/29/2014 0555   EOSABS 0.2 11/15/2022 1617   EOSABS 0.1 04/29/2014 0555   BASOSABS 0.0 11/15/2022 1617   BASOSABS 0.0 04/29/2014 0555    ***  Chest Imaging: CXR 11/17/22 nodular opacity seen on lateral CXR and prior  Pulmonary Functions Testing Results:    Latest  Ref Rng & Units 12/07/2022    8:28 AM  PFT Results  FVC-Pre L 3.29   FVC-Predicted Pre % 103   FVC-Post L 3.26   FVC-Predicted Post % 102   Pre FEV1/FVC % % 85   Post FEV1/FCV % % 85   FEV1-Pre L 2.79   FEV1-Predicted Pre % 115   FEV1-Post L 2.79   DLCO uncorrected ml/min/mmHg 24.80   DLCO UNC% % 122   DLVA Predicted % 115   TLC L  5.78   TLC % Predicted % 111   RV % Predicted % 112    Excellent lung function, increased diffusing capacity  FeNO: ***  Pathology: ***  Echocardiogram: ***  Heart Catheterization: ***    Assessment & Plan:    Plan:      Maryjane Hurter, MD [facility] 12/15/2022 6:45 PM

## 2022-12-16 ENCOUNTER — Encounter: Payer: Self-pay | Admitting: Student

## 2022-12-16 ENCOUNTER — Ambulatory Visit: Payer: Medicare PPO | Admitting: Student

## 2022-12-16 VITALS — BP 136/70 | HR 58 | Temp 98.2°F | Ht 65.0 in | Wt 166.0 lb

## 2022-12-16 DIAGNOSIS — R911 Solitary pulmonary nodule: Secondary | ICD-10-CM

## 2022-12-16 DIAGNOSIS — J42 Unspecified chronic bronchitis: Secondary | ICD-10-CM

## 2022-12-16 NOTE — Patient Instructions (Addendum)
Continue Breo 100, 1 puff daily  Use albuterol as needed for chest tightness wheezing or shortness of breath (or in your case cough) Flonase 2 sprays each nostril daily Cetirizine 10 mg daily If increased sensation of chest congestion, productive cough: - mucinex 600 mg to 1200 mg twice daily to thin mucus - flutter valve 10 slow but firm puffs twice daily (in morning after Breo, and in evening after 1-2 puffs albuterol) See you in 4 weeks or sooner if need be!

## 2022-12-20 ENCOUNTER — Ambulatory Visit (INDEPENDENT_AMBULATORY_CARE_PROVIDER_SITE_OTHER): Payer: Self-pay | Admitting: Dermatology

## 2022-12-20 VITALS — BP 131/66

## 2022-12-20 DIAGNOSIS — L814 Other melanin hyperpigmentation: Secondary | ICD-10-CM

## 2022-12-20 NOTE — Progress Notes (Signed)
   Follow-Up Visit   Subjective  Brenda Burns is a 67 y.o. female who presents for the following: Lentigos (Face, pt presents for BBL).   The following portions of the chart were reviewed this encounter and updated as appropriate:       Review of Systems:  No other skin or systemic complaints except as noted in HPI or Assessment and Plan.  Objective  Well appearing patient in no apparent distress; mood and affect are within normal limits.  A focused examination was performed including face. Relevant physical exam findings are noted in the Assessment and Plan.  face Scattered tan macules.              Assessment & Plan  Lentigines face  BBL today Laser kit given to patient Gel cooling mask given to patient  Photorejuvenation - face Prior to the procedure, the patient's past medical history, medications, allergies, and the rare but potential risks and complications were reviewed with the patient and a signed consent was obtained.  Pre and post treatment care was discussed and instructions provided.   [place] BBL - 12/20/22 1500      Patient Details   Skin Type: II    Anesthestic Cream Applied: No    Photo Takes: Yes    Consent Signed: Yes      Treatment Details   Date: 12/20/22    Treatment #: 1    Area: face    Filter: 1st Pass      1st Pass   Device: 515 filter    BBL j/cm2: 12    PW Msec Sec: 10    Cooling Temp: 25    Pulses: 75    15x45: this crystal used    15x15: this crystal used          Patient tolerated the procedure well.   Nancy Fetter avoidance was stressed. The patient will call with any problems, questions or concerns prior to their next appointment.     Return for 4-6 weeks, BBL f/u.  I, Othelia Pulling, RMA, am acting as scribe for [name], MD .  Documentation: I have reviewed the above documentation for accuracy and completeness, and I agree with the above.  [name]

## 2022-12-20 NOTE — Patient Instructions (Signed)
Due to recent changes in healthcare laws, you may see results of your pathology and/or laboratory studies on MyChart before the doctors have had a chance to review them. We understand that in some cases there may be results that are confusing or concerning to you. Please understand that not all results are received at the same time and often the doctors may need to interpret multiple results in order to provide you with the best plan of care or course of treatment. Therefore, we ask that you please give us 2 business days to thoroughly review all your results before contacting the office for clarification. Should we see a critical lab result, you will be contacted sooner.   If You Need Anything After Your Visit  If you have any questions or concerns for your doctor, please call our main line at [phone] and press option 4 to reach your doctor's medical assistant. If no one answers, please leave a voicemail as directed and we will return your call as soon as possible. Messages left after 4 pm will be answered the following business day.   You may also send us a message via MyChart. We typically respond to MyChart messages within 1-2 business days.  For prescription refills, please ask your pharmacy to contact our office. Our fax number is [phone].  If you have an urgent issue when the clinic is closed that cannot wait until the next business day, you can page your doctor at the number below.    Please note that while we do our best to be available for urgent issues outside of office hours, we are not available 24/7.   If you have an urgent issue and are unable to reach us, you may choose to seek medical care at your doctor's office, retail clinic, urgent care center, or emergency room.  If you have a medical emergency, please immediately call 911 or go to the emergency department.  Pager Numbers  - Dr. Kowalski: [phone]  - Dr. Moye: [phone]  - Dr. Stewart:  [phone]  In the event of inclement weather, please call our main line at [phone] for an update on the status of any delays or closures.  Dermatology Medication Tips: Please keep the boxes that topical medications come in in order to help keep track of the instructions about where and how to use these. Pharmacies typically print the medication instructions only on the boxes and not directly on the medication tubes.   If your medication is too expensive, please contact our office at [phone] option 4 or send us a message through MyChart.   We are unable to tell what your co-pay for medications will be in advance as this is different depending on your insurance coverage. However, we may be able to find a substitute medication at lower cost or fill out paperwork to get insurance to cover a needed medication.   If a prior authorization is required to get your medication covered by your insurance company, please allow us 1-2 business days to complete this process.  Drug prices often vary depending on where the prescription is filled and some pharmacies may offer cheaper prices.  The website www.goodrx.com contains coupons for medications through different pharmacies. The prices here do not account for what the cost may be with help from insurance (it may be cheaper with your insurance), but the website can give you the price if you did not use any insurance.  - You can print the associated coupon and take it with   your prescription to the pharmacy.  - You may also stop by our office during regular business hours and pick up a GoodRx coupon card.  - If you need your prescription sent electronically to a different pharmacy, notify our office through [facility] MyChart or by phone at [phone] option 4.     Si Usted Necesita Algo Despus de Su Visita  Tambin puede enviarnos un mensaje a travs de MyChart. Por lo general respondemos a los mensajes de MyChart en el transcurso de 1 a 2  das hbiles.  Para renovar recetas, por favor pida a su farmacia que se ponga en contacto con nuestra oficina. Nuestro nmero de fax es el [phone].  Si tiene un asunto urgente cuando la clnica est cerrada y que no puede esperar hasta el siguiente da hbil, puede llamar/localizar a su doctor(a) al nmero que aparece a continuacin.   Por favor, tenga en cuenta que aunque hacemos todo lo posible para estar disponibles para asuntos urgentes fuera del horario de oficina, no estamos disponibles las 24 horas del da, los 7 das de la semana.   Si tiene un problema urgente y no puede comunicarse con nosotros, puede optar por buscar atencin mdica  en el consultorio de su doctor(a), en una clnica privada, en un centro de atencin urgente o en una sala de emergencias.  Si tiene una emergencia mdica, por favor llame inmediatamente al 911 o vaya a la sala de emergencias.  Nmeros de bper  - Dr. Kowalski: [phone]  - Dra. Moye: [phone]  - Dra. Stewart: [phone]  En caso de inclemencias del tiempo, por favor llame a nuestra lnea principal al [phone] para una actualizacin sobre el estado de cualquier retraso o cierre.  Consejos para la medicacin en dermatologa: Por favor, guarde las cajas en las que vienen los medicamentos de uso tpico para ayudarle a seguir las instrucciones sobre dnde y cmo usarlos. Las farmacias generalmente imprimen las instrucciones del medicamento slo en las cajas y no directamente en los tubos del medicamento.   Si su medicamento es muy caro, por favor, pngase en contacto con nuestra oficina llamando al [phone] y presione la opcin 4 o envenos un mensaje a travs de MyChart.   No podemos decirle cul ser su copago por los medicamentos por adelantado ya que esto es diferente dependiendo de la cobertura de su seguro. Sin embargo, es posible que podamos encontrar un medicamento sustituto a menor costo o llenar un formulario para que el  seguro cubra el medicamento que se considera necesario.   Si se requiere una autorizacin previa para que su compaa de seguros cubra su medicamento, por favor permtanos de 1 a 2 das hbiles para completar este proceso.  Los precios de los medicamentos varan con frecuencia dependiendo del lugar de dnde se surte la receta y alguna farmacias pueden ofrecer precios ms baratos.  El sitio web www.goodrx.com tiene cupones para medicamentos de diferentes farmacias. Los precios aqu no tienen en cuenta lo que podra costar con la ayuda del seguro (puede ser ms barato con su seguro), pero el sitio web puede darle el precio si no utiliz ningn seguro.  - Puede imprimir el cupn correspondiente y llevarlo con su receta a la farmacia.  - Tambin puede pasar por nuestra oficina durante el horario de atencin regular y recoger una tarjeta de cupones de GoodRx.  - Si necesita que su receta se enve electrnicamente a una farmacia diferente, informe a nuestra oficina a travs de MyChart de    o por telfono llamando al [phone] y presione la opcin 4.  

## 2023-01-10 ENCOUNTER — Other Ambulatory Visit: Payer: Self-pay | Admitting: Internal Medicine

## 2023-01-12 ENCOUNTER — Ambulatory Visit: Payer: Medicare PPO | Admitting: Internal Medicine

## 2023-01-13 ENCOUNTER — Ambulatory Visit
Admission: RE | Admit: 2023-01-13 | Discharge: 2023-01-13 | Disposition: A | Discharge status: Home / Self Care | Payer: Medicare PPO | Source: Ambulatory Visit | Attending: Student | Admitting: Student

## 2023-01-13 DIAGNOSIS — J42 Unspecified chronic bronchitis: Secondary | ICD-10-CM | POA: Diagnosis not present

## 2023-01-13 DIAGNOSIS — R590 Localized enlarged lymph nodes: Secondary | ICD-10-CM | POA: Diagnosis not present

## 2023-01-13 DIAGNOSIS — J984 Other disorders of lung: Secondary | ICD-10-CM | POA: Diagnosis not present

## 2023-01-13 DIAGNOSIS — R918 Other nonspecific abnormal finding of lung field: Secondary | ICD-10-CM | POA: Diagnosis not present

## 2023-01-20 ENCOUNTER — Ambulatory Visit: Payer: Medicare PPO | Admitting: Student

## 2023-01-27 ENCOUNTER — Ambulatory Visit: Payer: Medicare PPO | Admitting: Student

## 2023-01-31 ENCOUNTER — Ambulatory Visit: Payer: Medicare PPO | Admitting: Dermatology

## 2023-02-16 ENCOUNTER — Ambulatory Visit: Payer: Medicare PPO | Admitting: Internal Medicine

## 2023-02-24 ENCOUNTER — Ambulatory Visit: Payer: Medicare PPO | Admitting: Student

## 2023-03-08 ENCOUNTER — Telehealth: Payer: Self-pay | Admitting: Internal Medicine

## 2023-03-08 DIAGNOSIS — J984 Other disorders of lung: Secondary | ICD-10-CM | POA: Diagnosis not present

## 2023-03-08 DIAGNOSIS — J411 Mucopurulent chronic bronchitis: Secondary | ICD-10-CM

## 2023-03-08 DIAGNOSIS — R062 Wheezing: Secondary | ICD-10-CM | POA: Diagnosis not present

## 2023-03-08 DIAGNOSIS — R911 Solitary pulmonary nodule: Secondary | ICD-10-CM

## 2023-03-08 NOTE — Addendum Note (Signed)
Addended by: Charm Barges on: 03/08/2023 05:59 PM   Modules accepted: Orders

## 2023-03-08 NOTE — Telephone Encounter (Signed)
Order placed for pulmonary referral to Dr Tim Lair.  Someone should be contacting her with an appt date and time.

## 2023-03-08 NOTE — Telephone Encounter (Signed)
Patient called and is requesting a referral to Dr Rayburn Ma, pulmonary with [facility]. Fax number is [phone] Adolph Pollack.   The reason changing pulmonary providers,because where she is going now the doctor either leaves or retires.

## 2023-03-08 NOTE — Telephone Encounter (Signed)
Patient would like to establish care with Dr Karna Christmas at [facility]. Ok to send new referral?

## 2023-03-09 NOTE — Telephone Encounter (Signed)
LM for patient letting her know.

## 2023-03-15 ENCOUNTER — Ambulatory Visit: Payer: Medicare PPO | Admitting: Podiatry

## 2023-03-15 ENCOUNTER — Encounter: Payer: Self-pay | Admitting: Internal Medicine

## 2023-03-15 ENCOUNTER — Ambulatory Visit (INDEPENDENT_AMBULATORY_CARE_PROVIDER_SITE_OTHER): Payer: Medicare PPO

## 2023-03-15 DIAGNOSIS — S92501A Displaced unspecified fracture of right lesser toe(s), initial encounter for closed fracture: Secondary | ICD-10-CM

## 2023-03-15 DIAGNOSIS — M79671 Pain in right foot: Secondary | ICD-10-CM

## 2023-03-15 DIAGNOSIS — S92912A Unspecified fracture of left toe(s), initial encounter for closed fracture: Secondary | ICD-10-CM | POA: Insufficient documentation

## 2023-03-15 NOTE — Progress Notes (Signed)
   Chief Complaint  Patient presents with   Foot Injury    Patient came in today for right foot injury, patient kicked a rock, 3 weeks ago, swelling, throbbing, rate of pain 8 out of 10, X-Rays done today     HPI: 67 y.o. female presenting today as a reestablish new patient for evaluation of a right [place]  [place]  She has had pain and tenderness ever since.  She has not really been resting the foot since the injury occurred.  Past Medical History:  Diagnosis Date   Arthritis    Asthma    Basal cell carcinoma 08/01/2017   Left spinal mid lower back. Nodular.   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 Excised 11/26/2009, margins free.    Past Surgical History:  Procedure Laterality Date   ABDOMINAL HYSTERECTOMY  2004   endometriosis   TONSILLECTOMY  1060's    Allergies  Allergen Reactions   Penicillins     Hives, rash     Physical Exam: General: The patient is alert and oriented x3 in no acute distress.  Dermatology: Skin is warm, dry and supple bilateral lower extremities.   Vascular: Palpable pedal pulses bilaterally. Capillary refill within normal limits.  No erythema.  Moderate edema noted to the forefoot  Neurological: Grossly intact via light touch  Musculoskeletal Exam: Pain throughout the entire forefoot and the lesser MTPs.  Majority of the pain is focused on the fourth toe  Radiographic Exam LT foot 03/15/2023:  Normal osseous mineralization. Joint spaces preserved.  Minimally displaced oblique fracture noted to the proximal phalanx of the fourth toe right foot  Assessment/Plan of Care: 1.  Fracture left fourth toe proximal phalanx; closed, minimally  displaced, initial encounter.  DOI: 02/26/2023 2.  Forefoot contusion/injury  -Patient evaluated.  X-rays reviewed -Recommend rest ice and elevation -Postop shoe dispensed.  WBAT -Return to clinic 8 weeks     Felecia Shelling, DPM [facility]  Dr. Felecia Shelling, DPM    [address] [place], [place] [address]                Office [phone]  Fax [phone]

## 2023-03-17 ENCOUNTER — Ambulatory Visit: Payer: Medicare PPO | Admitting: Student

## 2023-03-29 ENCOUNTER — Ambulatory Visit: Payer: Medicare PPO | Admitting: Dermatology

## 2023-03-29 ENCOUNTER — Encounter: Payer: Self-pay | Admitting: Internal Medicine

## 2023-03-29 ENCOUNTER — Ambulatory Visit: Payer: Medicare PPO | Admitting: Internal Medicine

## 2023-03-29 VITALS — BP 122/70 | HR 63 | Temp 97.9°F | Resp 16 | Ht 65.0 in | Wt 167.6 lb

## 2023-03-29 DIAGNOSIS — D696 Thrombocytopenia, unspecified: Secondary | ICD-10-CM | POA: Diagnosis not present

## 2023-03-29 DIAGNOSIS — E7800 Pure hypercholesterolemia, unspecified: Secondary | ICD-10-CM

## 2023-03-29 DIAGNOSIS — J411 Mucopurulent chronic bronchitis: Secondary | ICD-10-CM | POA: Diagnosis not present

## 2023-03-29 DIAGNOSIS — S92505D Nondisplaced unspecified fracture of left lesser toe(s), subsequent encounter for fracture with routine healing: Secondary | ICD-10-CM

## 2023-03-29 DIAGNOSIS — Z803 Family history of malignant neoplasm of breast: Secondary | ICD-10-CM

## 2023-03-29 DIAGNOSIS — F439 Reaction to severe stress, unspecified: Secondary | ICD-10-CM

## 2023-03-29 LAB — BASIC METABOLIC PANEL
BUN: 13 mg/dL (ref 6–23)
CO2: 29 mEq/L (ref 19–32)
Calcium: 9.5 mg/dL (ref 8.4–10.5)
Chloride: 103 mEq/L (ref 96–112)
Creatinine, Ser: 0.8 mg/dL (ref 0.40–1.20)
GFR: 75.36 mL/min (ref >60.00)
Glucose, Bld: 89 mg/dL (ref 70–99)
Potassium: 4.1 mEq/L (ref 3.5–5.1)
Sodium: 138 mEq/L (ref 135–145)

## 2023-03-29 LAB — HEPATIC FUNCTION PANEL
ALT: 32 U/L (ref 0–35)
AST: 27 U/L (ref 0–37)
Albumin: 4.2 g/dL (ref 3.5–5.2)
Alkaline Phosphatase: 72 U/L (ref 39–117)
Bilirubin, Direct: 0.1 mg/dL (ref 0.0–0.3)
Total Bilirubin: 0.8 mg/dL (ref 0.2–1.2)
Total Protein: 7 g/dL (ref 6.0–8.3)

## 2023-03-29 LAB — LIPID PANEL
Cholesterol: 227 mg/dL — ABNORMAL HIGH (ref 0–200)
HDL: 72.5 mg/dL (ref >39.00)
LDL Cholesterol: 137 mg/dL — ABNORMAL HIGH (ref 0–99)
NonHDL: 154.42
Total CHOL/HDL Ratio: 3
Triglycerides: 86 mg/dL (ref 0.0–149.0)
VLDL: 17.2 mg/dL (ref 0.0–40.0)

## 2023-03-29 NOTE — Progress Notes (Signed)
Subjective:    [name], female    DOB: 1956/08/23, 67 y.o.   MRN: [id number]  Patient here for  Chief Complaint  Patient presents with   Medical Management of Chronic Issues    HPI Here for scheduled follow up - f/u regarding stress and hypercholesterolemia.  On pravastatin. Intolerance to multiple statin medications. Has had history of recurrent bronchitis.  Previously seeing Dr Kendrick Fries.  PFTs 11/2013 - normal.  Saw pulmonary 11/2022 - cxr, exhaled nitric oxide test and recommended to start Breo. PFT 12/07/22 - normal lung function and increased diffusion capacity.  Reevaluated 12/16/22.  Recommended continue Breo and start flutter valve.  Also continue flonase and cetirizine.  CT chest 01/13/23 - No evidence of interstitial lung disease. Mild bland postinfectious/postinflammatory scarring in the mid to lower lungs. Moderate patchy air trapping in both lungs, suggesting small airways disease. Due to see Dr Tim Lair 04/05/23.  Had recent flare/infection. Treated with "amoxicillin" and was placed on prednisone. Completed prednisone 2-3 days ago.  Is doing better.  No sob.  Breathing getting back to baseline.  Has not needed her albuterol recently.  Continues on breo. Saw Dr Logan Bores 03/15/23 - right foot injury (sustained 02/26/23). Xray - fracture of left fourth toe.  In post op shoe now.  No pain.  Eating.  No nausea or vomiting.  No abdominal pain.     Past Medical History:  Diagnosis Date   Arthritis    Asthma    Basal cell carcinoma 08/01/2017   Left spinal mid lower back. Nodular.   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 Excised 11/26/2009, margins free.   Past Surgical History:  Procedure Laterality Date   ABDOMINAL HYSTERECTOMY  2004   endometriosis   TONSILLECTOMY  1060's    Family History  Problem Relation Age of Onset   COPD Mother    Cancer Mother        breast and colon   Breast cancer Mother 50   Diabetes Paternal Grandfather    Social History   Socioeconomic History   Marital status: Married    Spouse name: Not on file   Number of children: Not on file   Years of education: Not on file   Highest education level: Not on file  Occupational History   Not on file  Tobacco Use   Smoking status: Never   Smokeless tobacco: Never  Substance and Sexual Activity   Alcohol use: Yes    Comment: glass of wine occasionally   Drug use: No   Sexual activity: Yes    Birth control/protection: None  Other Topics Concern   Not on file  Social History Narrative   Not on file   Social Determinants of Health   Financial Resource Strain: Low Risk  (12/10/2022)   Overall Financial Resource Strain (CARDIA)    Difficulty of Paying Living Expenses: Not hard at all  Food Insecurity: No Food Insecurity (12/10/2022)   Hunger Vital Sign    Worried About Running Out of Food in the Last Year: Never true    Ran Out of Food in the Last Year: Never true  Transportation Needs: No Transportation Needs (12/10/2022)   PRAPARE - Administrator, Civil Service (Medical): No  Lack of Transportation (Non-Medical): No  Physical Activity: Insufficiently Active (12/10/2022)   Exercise Vital Sign    Days of Exercise per Week: 3 days    Minutes of Exercise per Session: 30 min  Stress: No Stress Concern Present (12/10/2022)   [employer] of Occupational Health - Occupational Stress Questionnaire    Feeling of Stress : Not at all  Social Connections: Moderately Integrated (12/10/2022)   Social Connection and Isolation Panel [NHANES]    Frequency of Communication with Friends and Family: More than three times a week    Frequency of Social Gatherings with Friends and Family: Once a week    Attends Religious Services: 1 to 4 times per year    Active Member of [employer] or  Organizations: No    Attends Engineer, structural: Patient declined    Marital Status: Married     Review of Systems  Constitutional:  Negative for appetite change and unexpected weight change.  HENT:  Negative for sinus pressure and sore throat.        Congestion better.   Respiratory:  Negative for chest tightness and shortness of breath.        Cough improved.   Cardiovascular:  Negative for chest pain, palpitations and leg swelling.  Gastrointestinal:  Negative for abdominal pain, diarrhea, nausea and vomiting.       No acid reflux.   Genitourinary:  Negative for difficulty urinating and dysuria.  Musculoskeletal:  Negative for joint swelling and myalgias.  Skin:  Negative for color change and rash.  Neurological:  Negative for dizziness and headaches.  Psychiatric/Behavioral:  Negative for agitation and dysphoric mood.        Objective:     BP 122/70   Pulse 63   Temp 97.9 F (36.6 C)   Resp 16   Ht 5\' 5"  (1.651 m)   Wt 167 lb 9.6 oz (76 kg)   SpO2 98%   BMI 27.89 kg/m  Wt Readings from Last 3 Encounters:  03/29/23 167 lb 9.6 oz (76 kg)  12/16/22 166 lb (75.3 kg)  12/10/22 170 lb (77.1 kg)    Physical Exam Vitals reviewed.  Constitutional:      General: She is not in acute distress.    Appearance: Normal appearance.  HENT:     Head: Normocephalic and atraumatic.     Right Ear: External ear normal.     Left Ear: External ear normal.  Eyes:     General: No scleral icterus.       Right eye: No discharge.        Left eye: No discharge.     Conjunctiva/sclera: Conjunctivae normal.  Neck:     Thyroid: No thyromegaly.  Cardiovascular:     Rate and Rhythm: Normal rate and regular rhythm.  Pulmonary:     Effort: No respiratory distress.     Breath sounds: Normal breath sounds. No wheezing.  Abdominal:     General: Bowel sounds are normal.     Palpations: Abdomen is soft.     Tenderness: There is no abdominal tenderness.  Musculoskeletal:         General: No swelling or tenderness.     Cervical back: Neck supple. No tenderness.  Lymphadenopathy:     Cervical: No cervical adenopathy.  Skin:    Findings: No erythema or rash.  Neurological:     Mental Status: She is alert.  Psychiatric:        Mood and Affect: Mood normal.  Behavior: Behavior normal.      Outpatient Encounter Medications as of 03/29/2023  Medication Sig   albuterol (VENTOLIN HFA) 108 (90 Base) MCG/ACT inhaler Inhale 2 puffs into the lungs every 6 (six) hours as needed for wheezing or shortness of breath.   Calcium Carbonate-Vitamin D (CALTRATE 600+D PO) Take by mouth.   fluticasone (FLONASE) 50 MCG/ACT nasal spray Place 2 sprays into both nostrils daily.   fluticasone furoate-vilanterol (BREO ELLIPTA) 100-25 MCG/ACT AEPB Inhale 1 puff into the lungs daily.   Multiple Vitamins-Minerals (CENTRUM SILVER PO) Take by mouth.   pravastatin (PRAVACHOL) 40 MG tablet TAKE ONE TABLET BY MOUTH DAILY   No facility-administered encounter medications on file as of 03/29/2023.     Lab Results  Component Value Date   WBC 5.6 11/15/2022   HGB 13.9 11/15/2022   HCT 41.7 11/15/2022   PLT 201.0 11/15/2022   GLUCOSE 89 03/29/2023   CHOL 227 (H) 03/29/2023   TRIG 86.0 03/29/2023   HDL 72.50 03/29/2023   LDLCALC 137 (H) 03/29/2023   ALT 32 03/29/2023   AST 27 03/29/2023   NA 138 03/29/2023   K 4.1 03/29/2023   CL 103 03/29/2023   CREATININE 0.80 03/29/2023   BUN 13 03/29/2023   CO2 29 03/29/2023   TSH 1.69 07/13/2022    CT CHEST HIGH RESOLUTION  Result Date: 01/17/2023 CLINICAL DATA:  Abnormal chest radiograph. Chronic bronchitis. Assess for bronchiectasis/interstitial lung disease. EXAM: CT CHEST WITHOUT CONTRAST TECHNIQUE: Multidetector CT imaging of the chest was performed following the standard protocol without intravenous contrast. High resolution imaging of the lungs, as well as inspiratory and expiratory imaging, was performed. RADIATION DOSE REDUCTION:  This exam was performed according to the departmental dose-optimization program which includes automated exposure control, adjustment of the mA and/or kV according to patient size and/or use of iterative reconstruction technique. COMPARISON:  11/15/2022 chest radiograph.  11/01/2013 chest CT. FINDINGS: Cardiovascular: Normal heart size. No significant pericardial effusion/thickening. Great vessels are normal in course and caliber. Mediastinum/Nodes: No significant thyroid nodules. Unremarkable esophagus. No pathologically enlarged axillary, mediastinal or hilar lymph nodes, noting limited sensitivity for the detection of hilar adenopathy on this noncontrast study. Lungs/Pleura: No pneumothorax. No pleural effusion. No acute consolidative airspace disease or lung masses. Solid 0.3 cm peripheral right lower lobe (series 7/image 98) and 0.2 cm anterior right lower lobe (series 7/image 75) pulmonary nodules, both stable since 11/01/2013 chest CT and considered benign. No new significant pulmonary nodules. No significant regions of subpleural reticulation, ground-glass attenuation, traction bronchiectasis, architectural distortion or frank honeycombing. Moderate patchy air trapping in both lungs on the expiration sequence. Scattered small parenchymal bands in the mid to lower lungs bilaterally, most prominent in the medial right middle lobe, compatible with nonspecific mild postinfectious/postinflammatory scarring. Upper abdomen: No acute abnormality. Musculoskeletal: No aggressive appearing focal osseous lesions. Mild thoracic spondylosis. IMPRESSION: 1. No evidence of interstitial lung disease. Mild bland postinfectious/postinflammatory scarring in the mid to lower lungs. 2. Moderate patchy air trapping in both lungs, suggesting small airways disease. Electronically Signed   By: Delbert Phenix M.D.   On: 01/17/2023 08:53       Assessment & Plan:  Hypercholesterolemia Assessment & Plan: Had intolerance to multiple  statin medications.  Low cholesterol diet and exercise.  Follow lipid panel.  Continue pravastatin.   Orders: -     Basic metabolic panel -     Hepatic function panel -     Lipid panel  Bronchitis, mucopurulent recurrent (HCC) Assessment &  Plan: Previously seeing Dr Kendrick Fries.  PFTs 11/2013 - normal.  Saw pulmonary 11/2022 - cxr, exhaled nitric oxide test and recommended to start Breo. PFT 12/07/22 - normal lung function and increased diffusion capacity.  Reevaluated 12/16/22.  Recommended continue Breo and start flutter valve.  Also continue flonase and cetirizine.  CT chest 01/13/23 - No evidence of interstitial lung disease. Mild bland postinfectious/postinflammatory scarring in the mid to lower lungs. Moderate patchy air trapping in both lungs, suggesting small airways disease. Due to see Dr Tim Lair 04/05/23.  Had recent flare/infection. Treated with "amoxicillin" and was placed on prednisone. Completed prednisone 2-3 days ago.  Is doing better.  No sob.  Breathing getting back to baseline.  Has not needed her albuterol recently.  Continues on breo.    Family history of breast cancer Assessment & Plan: Mother with breast cancer. Previously referred for genetic counseling.    Stress Assessment & Plan: Overall appears to be handling things relatively well.  Follow.    Thrombocytopenia, unspecified (HCC) Assessment & Plan: Follow [facility].    Closed nondisplaced fracture of phalanx of lesser toe of left foot with routine healing, unspecified phalanx, subsequent encounter Assessment & Plan: Saw Dr Logan Bores 03/15/23 - right foot injury (sustained 02/26/23). Xray - fracture of left fourth toe.  In post op shoe now.  No pain.  Eating.  No nausea or vomiting.  No abdominal pain.       Dale Belvue, MD

## 2023-04-03 NOTE — Assessment & Plan Note (Signed)
Overall appears to be handling things relatively well.  Follow.   

## 2023-04-03 NOTE — Assessment & Plan Note (Signed)
Follow cbc.  

## 2023-04-03 NOTE — Assessment & Plan Note (Signed)
Mother with breast cancer. Previously referred for genetic counseling.

## 2023-04-03 NOTE — Assessment & Plan Note (Signed)
Had intolerance to multiple statin medications.  Low cholesterol diet and exercise.  Follow lipid panel.  Continue pravastatin.  

## 2023-04-03 NOTE — Assessment & Plan Note (Signed)
Previously seeing Dr Kendrick Fries.  PFTs 11/2013 - normal.  Saw pulmonary 11/2022 - cxr, exhaled nitric oxide test and recommended to start Breo. PFT 12/07/22 - normal lung function and increased diffusion capacity.  Reevaluated 12/16/22.  Recommended continue Breo and start flutter valve.  Also continue flonase and cetirizine.  CT chest 01/13/23 - No evidence of interstitial lung disease. Mild bland postinfectious/postinflammatory scarring in the mid to lower lungs. Moderate patchy air trapping in both lungs, suggesting small airways disease. Due to see Dr Tim Lair 04/05/23.  Had recent flare/infection. Treated with "amoxicillin" and was placed on prednisone. Completed prednisone 2-3 days ago.  Is doing better.  No sob.  Breathing getting back to baseline.  Has not needed her albuterol recently.  Continues on breo.

## 2023-04-03 NOTE — Assessment & Plan Note (Signed)
Saw Dr Logan Bores 03/15/23 - right foot injury (sustained 02/26/23). Xray - fracture of left fourth toe.  In post op shoe now.  No pain.  Eating.  No nausea or vomiting.  No abdominal pain.

## 2023-04-05 DIAGNOSIS — J411 Mucopurulent chronic bronchitis: Secondary | ICD-10-CM | POA: Diagnosis not present

## 2023-04-05 DIAGNOSIS — R0602 Shortness of breath: Secondary | ICD-10-CM | POA: Diagnosis not present

## 2023-04-18 ENCOUNTER — Ambulatory Visit: Payer: Medicare PPO | Admitting: Primary Care

## 2023-05-10 ENCOUNTER — Ambulatory Visit: Payer: Medicare PPO | Admitting: Podiatry

## 2023-05-26 ENCOUNTER — Other Ambulatory Visit: Payer: Self-pay | Admitting: Podiatry

## 2023-05-26 DIAGNOSIS — S92501A Displaced unspecified fracture of right lesser toe(s), initial encounter for closed fracture: Secondary | ICD-10-CM

## 2023-05-26 DIAGNOSIS — M79671 Pain in right foot: Secondary | ICD-10-CM

## 2023-08-02 ENCOUNTER — Other Ambulatory Visit: Payer: Self-pay | Admitting: Internal Medicine

## 2023-08-02 ENCOUNTER — Encounter: Payer: Medicare PPO | Admitting: Internal Medicine

## 2023-08-09 ENCOUNTER — Other Ambulatory Visit: Payer: Self-pay | Admitting: Internal Medicine

## 2023-08-09 DIAGNOSIS — Z1231 Encounter for screening mammogram for malignant neoplasm of breast: Secondary | ICD-10-CM

## 2023-09-26 ENCOUNTER — Ambulatory Visit
Admission: RE | Admit: 2023-09-26 | Discharge: 2023-09-26 | Disposition: A | Discharge status: Home / Self Care | Payer: Medicare PPO | Source: Ambulatory Visit | Attending: Internal Medicine | Admitting: Internal Medicine

## 2023-09-26 DIAGNOSIS — Z1231 Encounter for screening mammogram for malignant neoplasm of breast: Secondary | ICD-10-CM | POA: Diagnosis not present

## 2023-10-06 ENCOUNTER — Telehealth: Payer: Medicare PPO | Admitting: Nurse Practitioner

## 2023-10-06 ENCOUNTER — Telehealth: Payer: Self-pay

## 2023-10-06 VITALS — Ht 65.0 in | Wt 167.6 lb

## 2023-10-06 DIAGNOSIS — R051 Acute cough: Secondary | ICD-10-CM | POA: Diagnosis not present

## 2023-10-06 MED ORDER — DOXYCYCLINE HYCLATE 100 MG PO TABS: 100.0000 mg | ORAL_TABLET | Freq: Two times a day (BID) | ORAL | 0 refills | Status: DC

## 2023-10-06 MED ORDER — PREDNISONE 10 MG PO TABS: ORAL_TABLET | ORAL | 0 refills | Status: DC

## 2023-10-06 NOTE — Telephone Encounter (Signed)
 Pt scheduled for virtual visit for evaluation. Will also keep regular follow up with Dr Lorin Picket end of January.

## 2023-10-06 NOTE — Assessment & Plan Note (Signed)
 She exhibits symptoms concerning for acute bronchitis, including a cough lasting 3 days with greenish-brown sputum, chest congestion, and wheezing, but no fever, body aches, or significant shortness of breath. Her history includes recurrent bronchitis and asthma. We will start Doxycycline  and Prednisone , continue the use of her inhaler as needed, and advise her to stay hydrated and continue Tylenol as needed. Should symptoms worsen or persist despite medication, she should seek in-person medical attention.

## 2023-10-06 NOTE — Telephone Encounter (Signed)
 Copied from CRM [id number]. Topic: Clinical - Medical Advice >> Oct 06, 2023  8:49 AM Thersia BROCKS wrote: Reason for CRM: Patient is calling in wanting to be called in to [facility] in [place], [place] has been sick and spitting up discolored would like to be sent to [address], [place], [place] [address]. Is also requesting a callback

## 2023-10-06 NOTE — Progress Notes (Signed)
 MyChart Video Visit    Virtual Visit via Video Note   This visit type was conducted because this format is felt to be most appropriate for this patient at this time. Physical exam was limited by quality of the video and audio technology used for the visit. CMA was able to get the patient set up on a video visit.  Patient location: Home. Patient and provider in visit Provider location: Office  I discussed the limitations of evaluation and management by telemedicine and the availability of in person appointments. The patient expressed understanding and agreed to proceed.  Visit Date: 10/06/2023  Today's healthcare provider: Leron Glance, NP     Subjective:    [name], female    DOB: 03-Apr-1956, 68 y.o.   MRN: [id number]  Chief Complaint  Patient presents with   Cough    Coughing up phlegm, nasal congestion, headaches Sx's started: cough for 2-3 days No body aches     HPI  Discussed the use of AI scribe software for clinical note transcription with the patient, who gave verbal consent to proceed.  History of Present Illness   The patient, with a history of asthma and recurrent bronchitis, presents with a cough of three days duration. She initially thought it would resolve, but the cough has persisted and worsened. Accompanying the cough is the production of brownish-green sputum, which the patient describes as 'awful.' She also reports feeling 'terrible,' despite not having a fever.  The patient's symptoms are primarily located in the chest, with some congestion in the head. She has been experiencing some wheezing, but denies significant shortness of breath. She also reports a scratchy throat, likely due to the persistent coughing, but denies severe sore throat pain.  The patient has been managing her symptoms with Tylenol and her asthma inhaler. She has a known allergy to penicillins. In the past, her bronchitis has been successfully treated with  Doxycycline  and prednisone .  The patient also reports some fatigue, but denies body aches. She had a headache upon waking, but it resolved after eating. She denies any nausea or vomiting, and has not been exposed to any known cases of COVID or the flu.      Past Medical History:  Diagnosis Date   Arthritis    Asthma    Basal cell carcinoma 08/01/2017   Left spinal mid lower back. Nodular.   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 Excised 11/26/2009, margins free.    Past Surgical History:  Procedure Laterality Date   ABDOMINAL HYSTERECTOMY  2004   endometriosis   TONSILLECTOMY  1060's    Family History  Problem Relation Age of Onset   COPD Mother    Cancer Mother        breast and colon   Breast cancer Mother 82   Diabetes Paternal Grandfather     Social History   Socioeconomic History   Marital status: Married    Spouse name: Not on file   Number of children: Not on file   Years of education: Not on file   Highest education level: Not on file  Occupational History   Not on file  Tobacco Use   Smoking status: Never   Smokeless tobacco: Never  Substance and Sexual Activity  Alcohol use: Yes    Comment: glass of wine occasionally   Drug use: No   Sexual activity: Yes    Birth control/protection: None  Other Topics Concern   Not on file  Social History Narrative   Not on file   Social Drivers of Health   Financial Resource Strain: Low Risk  (12/10/2022)   Overall Financial Resource Strain (CARDIA)    Difficulty of Paying Living Expenses: Not hard at all  Food Insecurity: No Food Insecurity (12/10/2022)   Hunger Vital Sign    Worried About Running Out of Food in the Last Year: Never true    Ran Out of Food in the Last Year: Never true  Transportation Needs: No  Transportation Needs (12/10/2022)   PRAPARE - Administrator, Civil Service (Medical): No    Lack of Transportation (Non-Medical): No  Physical Activity: Insufficiently Active (12/10/2022)   Exercise Vital Sign    Days of Exercise per Week: 3 days    Minutes of Exercise per Session: 30 min  Stress: No Stress Concern Present (12/10/2022)   [employer] of Occupational Health - Occupational Stress Questionnaire    Feeling of Stress : Not at all  Social Connections: Moderately Integrated (12/10/2022)   Social Connection and Isolation Panel [NHANES]    Frequency of Communication with Friends and Family: More than three times a week    Frequency of Social Gatherings with Friends and Family: Once a week    Attends Religious Services: 1 to 4 times per year    Active Member of [employer] or Organizations: No    Attends Banker Meetings: Patient declined    Marital Status: Married  Catering Manager Violence: Not At Risk (12/10/2022)   Humiliation, Afraid, Rape, and Kick questionnaire    Fear of Current or Ex-Partner: No    Emotionally Abused: No    Physically Abused: No    Sexually Abused: No    Outpatient Medications Prior to Visit  Medication Sig Dispense Refill   albuterol  (VENTOLIN  HFA) 108 (90 Base) MCG/ACT inhaler Inhale 2 puffs into the lungs every 6 (six) hours as needed for wheezing or shortness of breath. 8 g 2   Calcium  Carbonate-Vitamin D (CALTRATE 600+D PO) Take by mouth.     fluticasone  (FLONASE ) 50 MCG/ACT nasal spray Place 2 sprays into both nostrils daily. 16 g 2   fluticasone  furoate-vilanterol (BREO ELLIPTA ) 100-25 MCG/ACT AEPB Inhale 1 puff into the lungs daily. 1 each 5   Multiple Vitamins-Minerals (CENTRUM SILVER PO) Take by mouth.     pravastatin  (PRAVACHOL ) 40 MG tablet TAKE ONE TABLET BY MOUTH DAILY 90 tablet 1   No facility-administered medications prior to visit.    Allergies  Allergen Reactions   Penicillins     Hives, rash    ROS See  HPI    Objective:    Physical Exam  Ht 5' 5 (1.651 m)   Wt 167 lb 9.6 oz (76 kg)   BMI 27.89 kg/m  Wt Readings from Last 3 Encounters:  10/06/23 167 lb 9.6 oz (76 kg)  03/29/23 167 lb 9.6 oz (76 kg)  12/16/22 166 lb (75.3 kg)   GENERAL: alert, oriented, appears well and in no acute distress   HEENT: atraumatic, conjunttiva clear, no obvious abnormalities on inspection of external nose and ears   NECK: normal movements of the head and neck   LUNGS: on inspection no signs of respiratory distress, breathing rate appears normal, no obvious gross SOB, gasping  or wheezing   CV: no obvious cyanosis   MS: moves all visible extremities without noticeable abnormality   PSYCH/NEURO: pleasant and cooperative, no obvious depression or anxiety, speech and thought processing grossly intact    Assessment & Plan:   Problem List Items Addressed This Visit       Other   Cough - Primary   She exhibits symptoms concerning for acute bronchitis, including a cough lasting 3 days with greenish-brown sputum, chest congestion, and wheezing, but no fever, body aches, or significant shortness of breath. Her history includes recurrent bronchitis and asthma. We will start Doxycycline  and Prednisone , continue the use of her inhaler as needed, and advise her to stay hydrated and continue Tylenol as needed. Should symptoms worsen or persist despite medication, she should seek in-person medical attention.      Relevant Medications   predniSONE  (DELTASONE ) 10 MG tablet   doxycycline  (VIBRA -TABS) 100 MG tablet    I am having Brenda MYRTIS Burns start on predniSONE  and doxycycline . I am also having her maintain her Calcium  Carbonate-Vitamin D (CALTRATE 600+D PO), Multiple Vitamins-Minerals (CENTRUM SILVER PO), fluticasone  furoate-vilanterol, albuterol , fluticasone , and pravastatin .  Meds ordered this encounter  Medications   predniSONE  (DELTASONE ) 10 MG tablet    Sig: TAKE 3 TABLETS PO QD FOR 3 DAYS THEN  TAKE 2 TABLETS PO QD FOR 3 DAYS THEN TAKE 1 TABLET PO QD FOR 3 DAYS THEN TAKE 1/2 TAB PO QD FOR 3 DAYS    Dispense:  20 tablet    Refill:  0    Supervising Provider:   MARIBETH, ERIC G [[id number]]   doxycycline  (VIBRA -TABS) 100 MG tablet    Sig: Take 1 tablet (100 mg total) by mouth 2 (two) times daily.    Dispense:  14 tablet    Refill:  0    Supervising Provider:   MARIBETH, ERIC G [[id number]]   I discussed the assessment and treatment plan with the patient. The patient was provided an opportunity to ask questions and all were answered. The patient agreed with the plan and demonstrated an understanding of the instructions.   The patient was advised to call back or seek an in-person evaluation if the symptoms worsen or if the condition fails to improve as anticipated.   Leron Glance, NP [facility] at [facility] [phone] (phone) [phone] (fax)  [facility]

## 2023-10-15 IMAGING — MG MM DIGITAL SCREENING BILAT W/ TOMO AND CAD
8 series · 8 of 24 positions shown · non-contrast
Comparison: Previous exam(s).

CLINICAL DATA: Screening.

EXAM:
DIGITAL SCREENING BILATERAL MAMMOGRAM WITH TOMOSYNTHESIS AND CAD
TECHNIQUE: Bilateral screening digital craniocaudal and mediolateral oblique
mammograms were obtained. Bilateral screening digital breast
tomosynthesis was performed. The images were evaluated with
computer-aided detection.

[L MLO synth-2D]
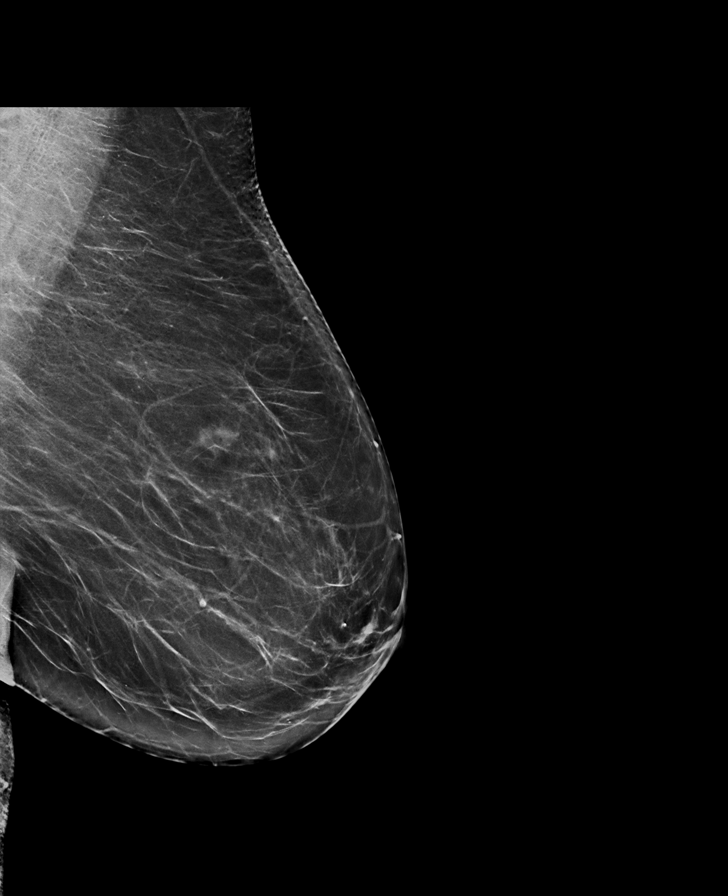

[R CC synth-2D]
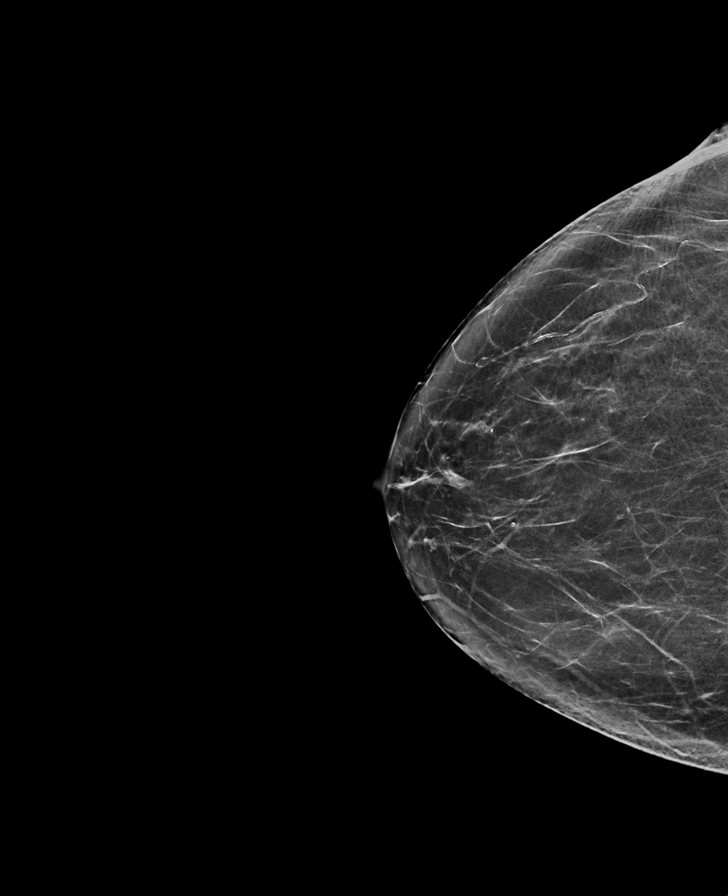

[L CC synth-2D]
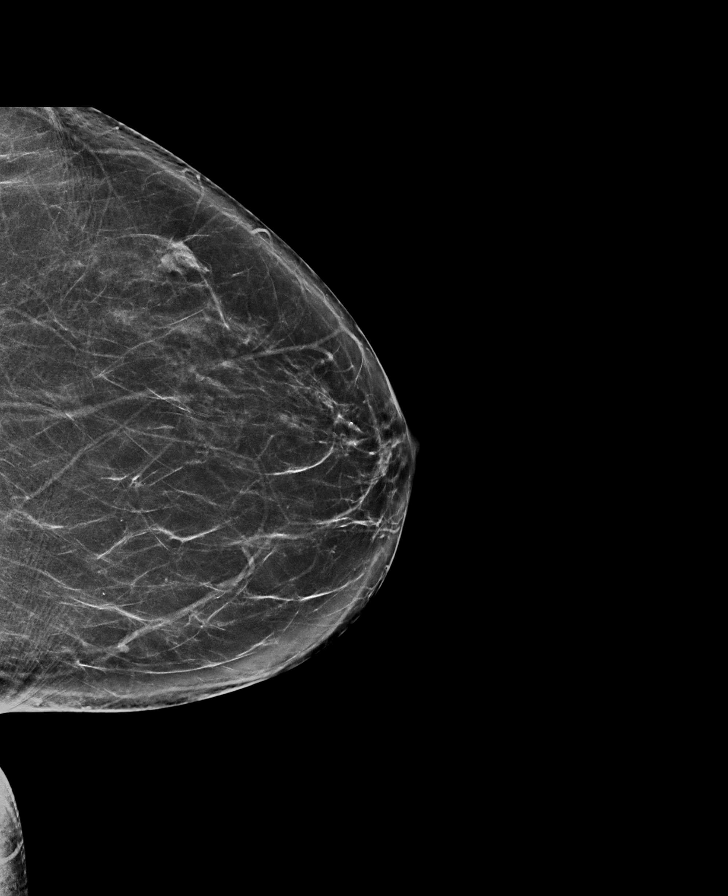

[R MLO synth-2D]
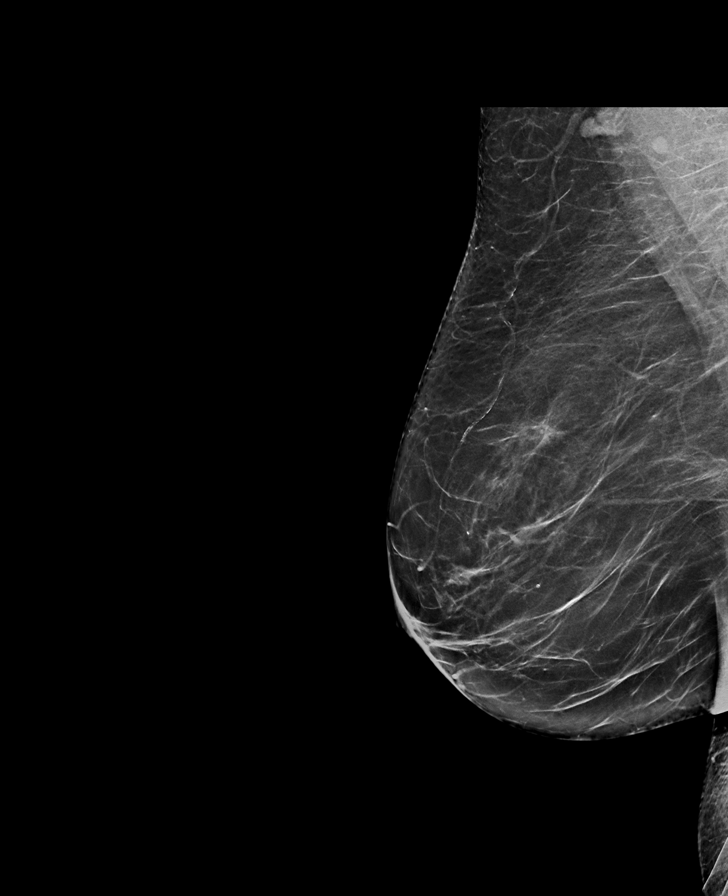

[L CC tomo · tomo slice 36/71.0]
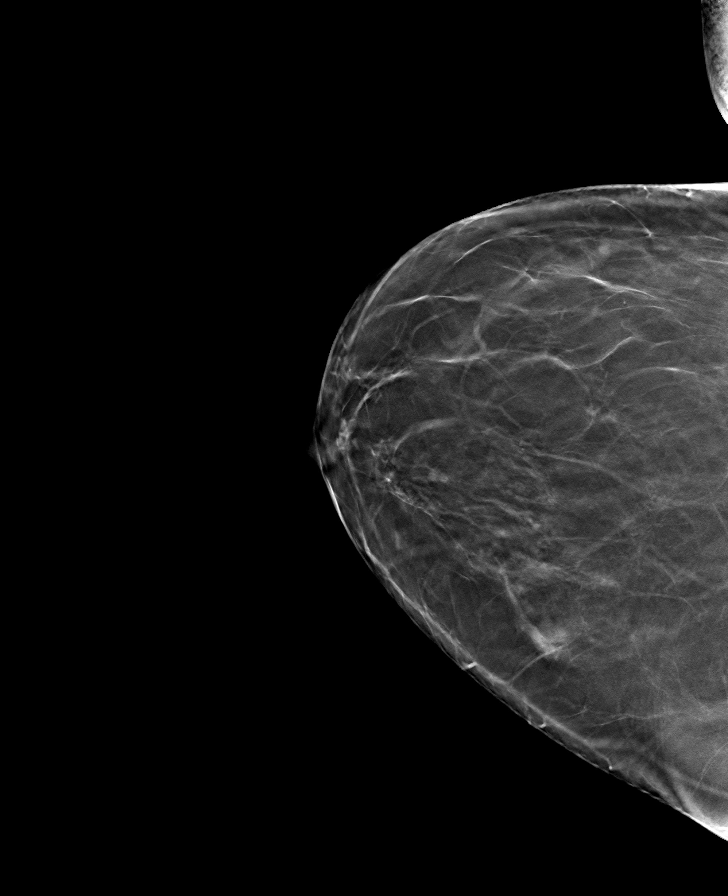

[R MLO tomo · tomo slice 39/76.0]
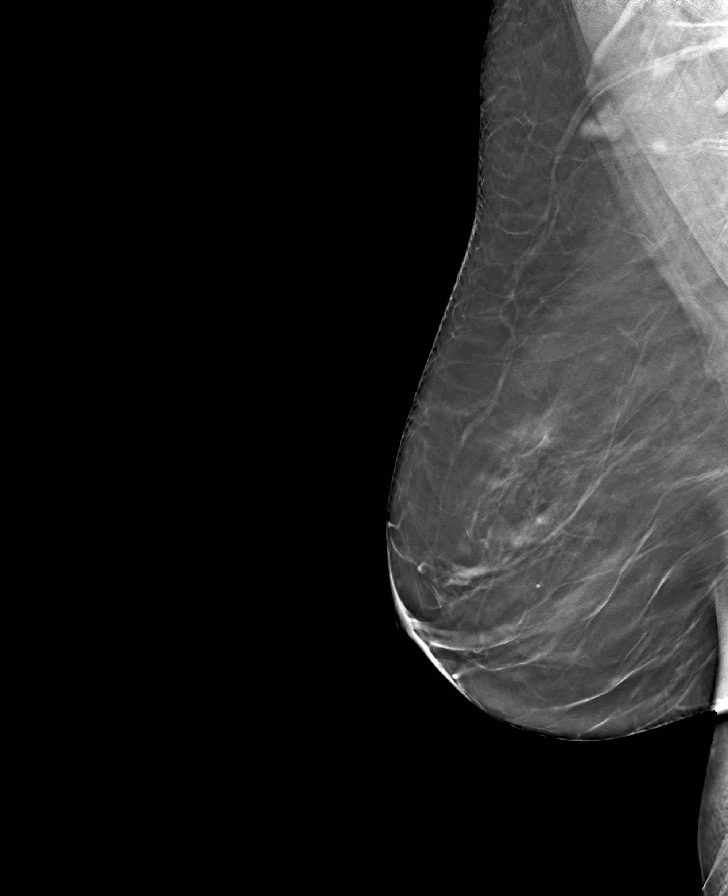

[R CC tomo · tomo slice 35/68.0]
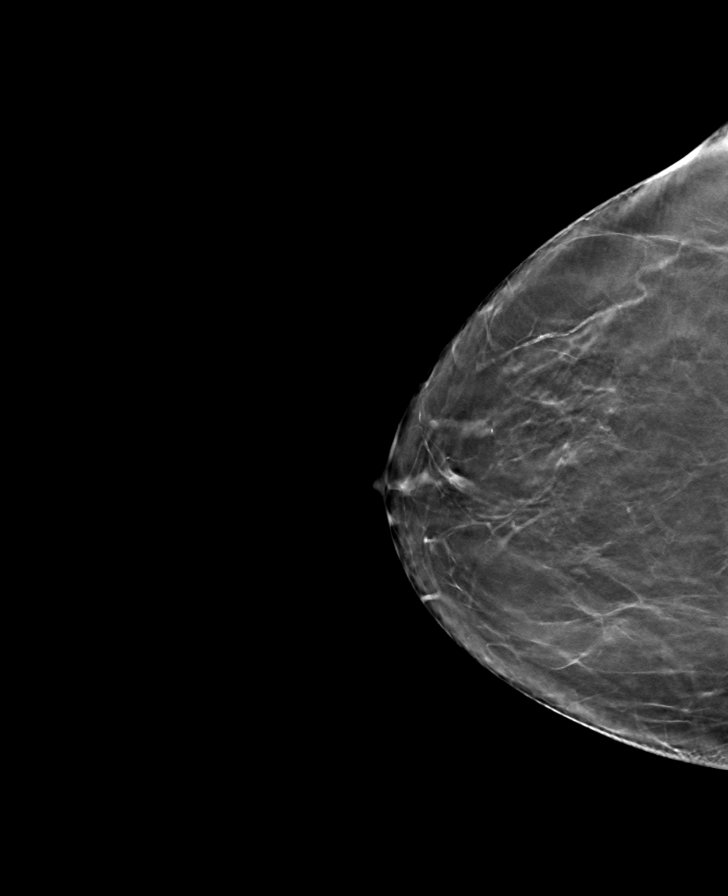

[L MLO tomo · tomo slice 40/79.0]
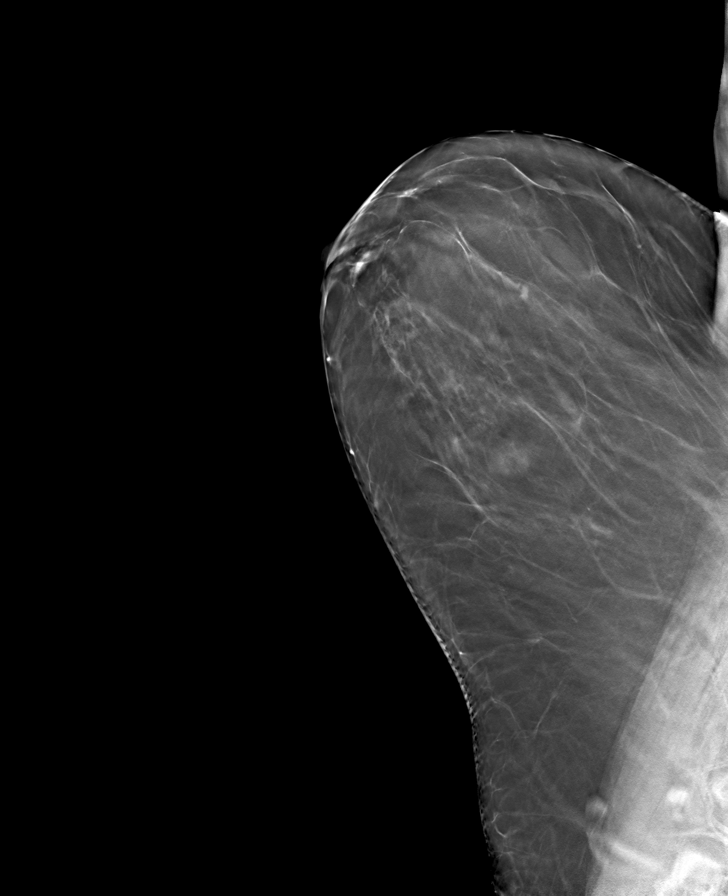

[8 of 24 positions shown; findings below may reference images not displayed]

ACR Breast Density Category b: There are scattered areas of
fibroglandular density.
FINDINGS: There are no findings suspicious for malignancy.
IMPRESSION: No mammographic evidence of malignancy. A result letter of this
screening mammogram will be mailed directly to the patient.

RECOMMENDATION:
Screening mammogram in one year. (Code:51-O-LD2)

BI-RADS CATEGORY  1: Negative.

## 2023-10-18 DIAGNOSIS — H25813 Combined forms of age-related cataract, bilateral: Secondary | ICD-10-CM | POA: Diagnosis not present

## 2023-10-26 DIAGNOSIS — M546 Pain in thoracic spine: Secondary | ICD-10-CM | POA: Diagnosis not present

## 2023-10-26 DIAGNOSIS — M5412 Radiculopathy, cervical region: Secondary | ICD-10-CM | POA: Diagnosis not present

## 2023-10-26 DIAGNOSIS — M9902 Segmental and somatic dysfunction of thoracic region: Secondary | ICD-10-CM | POA: Diagnosis not present

## 2023-10-26 DIAGNOSIS — M9901 Segmental and somatic dysfunction of cervical region: Secondary | ICD-10-CM | POA: Diagnosis not present

## 2023-10-28 DIAGNOSIS — M546 Pain in thoracic spine: Secondary | ICD-10-CM | POA: Diagnosis not present

## 2023-10-28 DIAGNOSIS — M5412 Radiculopathy, cervical region: Secondary | ICD-10-CM | POA: Diagnosis not present

## 2023-10-28 DIAGNOSIS — M9902 Segmental and somatic dysfunction of thoracic region: Secondary | ICD-10-CM | POA: Diagnosis not present

## 2023-10-28 DIAGNOSIS — M9901 Segmental and somatic dysfunction of cervical region: Secondary | ICD-10-CM | POA: Diagnosis not present

## 2023-10-31 DIAGNOSIS — M5412 Radiculopathy, cervical region: Secondary | ICD-10-CM | POA: Diagnosis not present

## 2023-11-02 ENCOUNTER — Other Ambulatory Visit: Payer: Self-pay | Admitting: Internal Medicine

## 2023-11-03 ENCOUNTER — Encounter: Payer: Medicare PPO | Admitting: Internal Medicine

## 2023-11-09 DIAGNOSIS — M5412 Radiculopathy, cervical region: Secondary | ICD-10-CM | POA: Diagnosis not present

## 2023-11-15 DIAGNOSIS — M25512 Pain in left shoulder: Secondary | ICD-10-CM | POA: Diagnosis not present

## 2023-11-15 DIAGNOSIS — M5412 Radiculopathy, cervical region: Secondary | ICD-10-CM | POA: Diagnosis not present

## 2023-11-28 DIAGNOSIS — M25512 Pain in left shoulder: Secondary | ICD-10-CM | POA: Diagnosis not present

## 2023-11-28 DIAGNOSIS — M5412 Radiculopathy, cervical region: Secondary | ICD-10-CM | POA: Diagnosis not present

## 2023-11-30 DIAGNOSIS — M5412 Radiculopathy, cervical region: Secondary | ICD-10-CM | POA: Diagnosis not present

## 2023-12-01 DIAGNOSIS — D3705 Neoplasm of uncertain behavior of pharynx: Secondary | ICD-10-CM | POA: Diagnosis not present

## 2023-12-19 DIAGNOSIS — M25512 Pain in left shoulder: Secondary | ICD-10-CM | POA: Diagnosis not present

## 2023-12-20 DIAGNOSIS — M25512 Pain in left shoulder: Secondary | ICD-10-CM | POA: Diagnosis not present

## 2023-12-28 DIAGNOSIS — M25512 Pain in left shoulder: Secondary | ICD-10-CM | POA: Diagnosis not present

## 2024-01-03 DIAGNOSIS — M25512 Pain in left shoulder: Secondary | ICD-10-CM | POA: Diagnosis not present

## 2024-01-04 DIAGNOSIS — M25512 Pain in left shoulder: Secondary | ICD-10-CM | POA: Diagnosis not present

## 2024-01-09 DIAGNOSIS — M25512 Pain in left shoulder: Secondary | ICD-10-CM | POA: Diagnosis not present

## 2024-01-16 DIAGNOSIS — M25512 Pain in left shoulder: Secondary | ICD-10-CM | POA: Diagnosis not present

## 2024-01-24 ENCOUNTER — Encounter: Payer: Medicare PPO | Admitting: Internal Medicine

## 2024-01-30 DIAGNOSIS — M25512 Pain in left shoulder: Secondary | ICD-10-CM | POA: Diagnosis not present

## 2024-02-08 DIAGNOSIS — M25512 Pain in left shoulder: Secondary | ICD-10-CM | POA: Diagnosis not present

## 2024-03-12 ENCOUNTER — Ambulatory Visit (INDEPENDENT_AMBULATORY_CARE_PROVIDER_SITE_OTHER)

## 2024-03-12 VITALS — Ht 65.0 in | Wt 167.0 lb

## 2024-03-12 DIAGNOSIS — Z0000 Encounter for general adult medical examination without abnormal findings: Secondary | ICD-10-CM

## 2024-03-12 NOTE — Progress Notes (Signed)
 Subjective:   Brenda Burns is a 68 y.o. who presents for a Medicare Wellness preventive visit.  As a reminder, Annual Wellness Visits don't include a physical exam, and some assessments may be limited, especially if this visit is performed virtually. We may recommend an in-person follow-up visit with your provider if needed.  Visit Complete: Virtual I connected with  Vedia Geralds on 03/12/24 by a audio enabled telemedicine application and verified that I am speaking with the correct person using two identifiers.  Patient Location: Home  Provider Location: Home Office  I discussed the limitations of evaluation and management by telemedicine. The patient expressed understanding and agreed to proceed.  Vital Signs: Because this visit was a virtual/telehealth visit, some criteria may be missing or patient reported. Any vitals not documented were not able to be obtained and vitals that have been documented are patient reported.  VideoDeclined- This patient declined Librarian, academic. Therefore the visit was completed with audio only.  Persons Participating in Visit: Patient.  AWV Questionnaire: No: Patient Medicare AWV questionnaire was not completed prior to this visit.  Cardiac Risk Factors include: advanced age (>79men, >12 women);dyslipidemia     Objective:     Today's Vitals   03/12/24 0852  Weight: 167 lb (75.8 kg)  Height: 5\' 5"  (1.651 m)   Body mass index is 27.79 kg/m.     03/12/2024    8:57 AM 12/10/2022    9:29 AM 12/10/2021    3:42 PM 12/10/2021    3:20 PM 06/04/2019    6:01 PM  Advanced Directives  Does Patient Have a Medical Advance Directive? No Yes Yes Yes No  Type of Special educational needs teacher of [place];Living will Healthcare Power of [place];Living will Healthcare Power of [place];Living will   Does patient want to make changes to medical advance directive?  No - Patient declined No - Patient declined Yes (ED -  Information included in AVS)   Copy of Healthcare Power of Attorney in Chart?  No - copy requested Yes - validated most recent copy scanned in chart (See row information) Yes - validated most recent copy scanned in chart (See row information)   Would patient like information on creating a medical advance directive? Yes (MAU/Ambulatory/Procedural Areas - Information given)        Current Medications (verified) Outpatient Encounter Medications as of 03/12/2024  Medication Sig   albuterol  (VENTOLIN  HFA) 108 (90 Base) MCG/ACT inhaler Inhale 2 puffs into the lungs every 6 (six) hours as needed for wheezing or shortness of breath.   Calcium  Carbonate-Vitamin D (CALTRATE 600+D PO) Take by mouth.   fluticasone  (FLONASE ) 50 MCG/ACT nasal spray Place 2 sprays into both nostrils daily.   fluticasone  furoate-vilanterol (BREO ELLIPTA ) 100-25 MCG/ACT AEPB Inhale 1 puff into the lungs daily.   Multiple Vitamins-Minerals (CENTRUM SILVER PO) Take by mouth.   pravastatin  (PRAVACHOL ) 40 MG tablet TAKE ONE TABLET BY MOUTH DAILY   doxycycline  (VIBRA -TABS) 100 MG tablet Take 1 tablet (100 mg total) by mouth 2 (two) times daily. (Patient not taking: Reported on 03/12/2024)   predniSONE  (DELTASONE ) 10 MG tablet TAKE 3 TABLETS PO QD FOR 3 DAYS THEN TAKE 2 TABLETS PO QD FOR 3 DAYS THEN TAKE 1 TABLET PO QD FOR 3 DAYS THEN TAKE 1/2 TAB PO QD FOR 3 DAYS (Patient not taking: Reported on 03/12/2024)   No facility-administered encounter medications on file as of 03/12/2024.    Allergies (verified) Penicillins   History: Past  Medical History:  Diagnosis Date   Arthritis    Asthma    Basal cell carcinoma 08/01/2017   Left spinal mid lower back. Nodular.   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 Excised 11/26/2009, margins free.   Past  Surgical History:  Procedure Laterality Date   ABDOMINAL HYSTERECTOMY  2004   endometriosis   TONSILLECTOMY  1060's   Family History  Problem Relation Age of Onset   COPD Mother    Cancer Mother        breast and colon   Breast cancer Mother 78   Diabetes Paternal Grandfather    Social History   Socioeconomic History   Marital status: Married    Spouse name: Not on file   Number of children: Not on file   Years of education: Not on file   Highest education level: Not on file  Occupational History   Not on file  Tobacco Use   Smoking status: Never   Smokeless tobacco: Never  Substance and Sexual Activity   Alcohol use: Yes    Comment: glass of wine occasionally   Drug use: No   Sexual activity: Yes    Birth control/protection: None  Other Topics Concern   Not on file  Social History Narrative   Not on file   Social Drivers of Health   Financial Resource Strain: Low Risk  (03/12/2024)   Overall Financial Resource Strain (CARDIA)    Difficulty of Paying Living Expenses: Not hard at all  Food Insecurity: No Food Insecurity (03/12/2024)   Hunger Vital Sign    Worried About Running Out of Food in the Last Year: Never true    Ran Out of Food in the Last Year: Never true  Transportation Needs: No Transportation Needs (03/12/2024)   PRAPARE - Administrator, Civil Service (Medical): No    Lack of Transportation (Non-Medical): No  Physical Activity: Insufficiently Active (03/12/2024)   Exercise Vital Sign    Days of Exercise per Week: 3 days    Minutes of Exercise per Session: 30 min  Stress: No Stress Concern Present (03/12/2024)   [employer] of Occupational Health - Occupational Stress Questionnaire    Feeling of Stress : Not at all  Social Connections: Moderately Integrated (03/12/2024)   Social Connection and Isolation Panel [NHANES]    Frequency of Communication with Friends and Family: More than three times a week    Frequency of Social Gatherings with  Friends and Family: Once a week    Attends Religious Services: 1 to 4 times per year    Active Member of [employer] or Organizations: No    Attends Engineer, structural: Never    Marital Status: Married    Tobacco Counseling Counseling given: Not Answered    Clinical Intake:  Pre-visit preparation completed: Yes  Pain : No/denies pain     Diabetes: No  No results found for: "HGBA1C"   How often do you need to have someone help you when you read instructions, pamphlets, or other written materials from your doctor or pharmacy?: 1 - Never  Interpreter Needed?: No  Information entered by :: Seabron Cypress LPN   Activities of Daily Living     03/12/2024    8:57 AM  In your  present state of health, do you have any difficulty performing the following activities:  Hearing? 0  Vision? 0  Difficulty concentrating or making decisions? 0  Walking or climbing stairs? 0  Dressing or bathing? 0  Doing errands, shopping? 0  Preparing Food and eating ? N  Using the Toilet? N  In the past six months, have you accidently leaked urine? N  Do you have problems with loss of bowel control? N  Managing your Medications? N  Managing your Finances? N  Housekeeping or managing your Housekeeping? N    Patient Care Team: Dellar Fenton, MD as PCP - General (Internal Medicine) Dellar Fenton, MD (Internal Medicine) Bary Boss., MD (Ophthalmology)  I have updated your Care Teams any recent Medical Services you may have received from other providers in the past year.     Assessment:    This is a routine wellness examination for Tiarrah.  Hearing/Vision screen Hearing Screening - Comments:: Denies hearing difficulties   Vision Screening - Comments:: Wears rx glasses - up to date with routine eye exams with Dr. Parke Boll    Goals Addressed             This Visit's Progress    Remain active and independent         Depression Screen     03/12/2024    8:55 AM 12/10/2022     9:27 AM 07/13/2022   10:33 AM 12/10/2021    3:43 PM 12/10/2021    3:22 PM 04/23/2021    9:51 AM 07/13/2017   11:48 AM  PHQ 2/9 Scores  PHQ - 2 Score 0 0 0 0 0 0 0    Fall Risk     03/12/2024    8:56 AM 12/10/2022    9:31 AM 07/13/2022   10:33 AM 12/10/2021    3:43 PM 12/10/2021    3:22 PM  Fall Risk   Falls in the past year? 0 0 0  0  Number falls in past yr: 0 0  0 0  Injury with Fall? 0 0  0 0  Risk for fall due to : No Fall Risks  No Fall Risks No Fall Risks No Fall Risks  Follow up Falls prevention discussed;Education provided;Falls evaluation completed Falls evaluation completed;Falls prevention discussed Falls evaluation completed Falls evaluation completed Falls evaluation completed    MEDICARE RISK AT HOME:  Medicare Risk at Home Any stairs in or around the home?: Yes If so, are there any without handrails?: No Home free of loose throw rugs in walkways, pet beds, electrical cords, etc?: Yes Adequate lighting in your home to reduce risk of falls?: Yes Life alert?: No Use of a cane, walker or w/c?: No Grab bars in the bathroom?: Yes Shower chair or bench in shower?: No Elevated toilet seat or a handicapped toilet?: Yes  TIMED UP AND GO:  Was the test performed?  No  Cognitive Function: 6CIT completed        03/12/2024    8:57 AM 12/10/2022    9:33 AM 12/10/2021    3:25 PM  6CIT Screen  What Year? 0 points 0 points 0 points  What month? 0 points 0 points 0 points  What time? 0 points 0 points 0 points  Count back from 20 0 points 0 points 0 points  Months in reverse 0 points 0 points 0 points  Repeat phrase 0 points 0 points 0 points  Total Score 0 points 0 points 0 points  Immunizations Immunization History  Administered Date(s) Administered   Fluad Quad(high Dose 65+) 08/16/2019, 07/13/2022   Influenza,inj,Quad PF,6+ Mos 06/13/2014, 10/07/2015, 07/13/2017   Influenza-Unspecified 07/14/2018, 08/26/2020, 08/04/2021   PFIZER(Purple Top)SARS-COV-2 Vaccination  11/10/2019, 12/04/2019   Pneumococcal Conjugate-13 12/27/2013   Pneumococcal Polysaccharide-23 10/26/2018   Zoster Recombinant(Shingrix) 10/21/2018, 03/05/2019    Screening Tests Health Maintenance  Topic Date Due   DTaP/Tdap/Td (1 - Tdap) Never done   COVID-19 Vaccine (3 - Pfizer risk series) 01/01/2020   Pneumonia Vaccine 35+ Years old (3 of 3 - PCV20 or PCV21) 10/27/2023   INFLUENZA VACCINE  05/04/2024   Medicare Annual Wellness (AWV)  03/12/2025   MAMMOGRAM  09/25/2025   Colonoscopy  09/17/2031   DEXA SCAN  Completed   Hepatitis C Screening  Completed   Zoster Vaccines- Shingrix  Completed   HPV VACCINES  Aged Out   Meningococcal B Vaccine  Aged Out    Health Maintenance  Health Maintenance Due  Topic Date Due   DTaP/Tdap/Td (1 - Tdap) Never done   COVID-19 Vaccine (3 - Pfizer risk series) 01/01/2020   Pneumonia Vaccine 10+ Years old (3 of 3 - PCV20 or PCV21) 10/27/2023    Additional Screening:  Vision Screening: Recommended annual ophthalmology exams for early detection of glaucoma and other disorders of the eye. Would you like a referral to an eye doctor? No    Dental Screening: Recommended annual dental exams for proper oral hygiene  Community Resource Referral / Chronic Care Management: CRR required this visit?  No   CCM required this visit?  No   Plan:    I have personally reviewed and noted the following in the patient's chart:   Medical and social history Use of alcohol, tobacco or illicit drugs  Current medications and supplements including opioid prescriptions. Patient is not currently taking opioid prescriptions. Functional ability and status Nutritional status Physical activity Advanced directives List of other physicians Hospitalizations, surgeries, and ER visits in previous 12 months Vitals Screenings to include cognitive, depression, and falls Referrals and appointments  In addition, I have reviewed and discussed with patient certain  preventive protocols, quality metrics, and best practice recommendations. A written personalized care plan for preventive services as well as general preventive health recommendations were provided to patient.   Seabron Cypress [place], [place]   06/09/[birth year]   After Visit Summary: (MyChart) Due to this being a telephonic visit, the after visit summary with patients personalized plan was offered to patient via MyChart   Notes: Nothing significant to report at this time.

## 2024-03-12 NOTE — Patient Instructions (Signed)
 Brenda Burns , Thank you for taking time out of your busy schedule to complete your Annual Wellness Visit with me. I enjoyed our conversation and look forward to speaking with you again next year. I, as well as your care team,  appreciate your ongoing commitment to your health goals. Please review the following plan we discussed and let me know if I can assist you in the future. Your Game plan/ To Do List    Follow up Visits: Next Medicare AWV with our clinical staff: In 1 year    Have you seen your provider in the last 6 months (3 months if uncontrolled diabetes)? No Next Office Visit with your provider: 04/02/24 @ 1:30  Clinician Recommendations:  Aim for 30 minutes of exercise or brisk walking, 6-8 glasses of water, and 5 servings of fruits and vegetables each day.       This is a list of the screening recommended for you and due dates:  Health Maintenance  Topic Date Due   DTaP/Tdap/Td vaccine (1 - Tdap) Never done   COVID-19 Vaccine (3 - Pfizer risk series) 01/01/2020   Pneumonia Vaccine (3 of 3 - PCV20 or PCV21) 10/27/2023   Flu Shot  05/04/2024   Medicare Annual Wellness Visit  03/12/2025   Mammogram  09/25/2025   Colon Cancer Screening  09/17/2031   DEXA scan (bone density measurement)  Completed   Hepatitis C Screening  Completed   Zoster (Shingles) Vaccine  Completed   HPV Vaccine  Aged Out   Meningitis B Vaccine  Aged Out    Advanced directives: (ACP Link)Information on Advanced Care Planning can be found at [place]  Secretary of [facility] Advance Health Care Directives Advance Health Care Directives. [email or web address]   Advance Care Planning is important because it:  [x]  Makes sure you receive the medical care that is consistent with your values, goals, and preferences  [x]  It provides guidance to your family and loved ones and reduces their decisional burden about whether or not they are making the right decisions based on your wishes.  Follow the link provided in your  after visit summary or read over the paperwork we have mailed to you to help you started getting your Advance Directives in place. If you need assistance in completing these, please reach out to us  so that we can help you!  See attachments for Preventive Care and Fall Prevention Tips.

## 2024-04-02 ENCOUNTER — Ambulatory Visit (INDEPENDENT_AMBULATORY_CARE_PROVIDER_SITE_OTHER): Admitting: Internal Medicine

## 2024-04-02 VITALS — BP 128/74 | HR 60 | Resp 16 | Ht 65.0 in | Wt 155.0 lb

## 2024-04-02 DIAGNOSIS — D3705 Neoplasm of uncertain behavior of pharynx: Secondary | ICD-10-CM | POA: Diagnosis not present

## 2024-04-02 DIAGNOSIS — R1314 Dysphagia, pharyngoesophageal phase: Secondary | ICD-10-CM | POA: Diagnosis not present

## 2024-04-02 DIAGNOSIS — F439 Reaction to severe stress, unspecified: Secondary | ICD-10-CM | POA: Diagnosis not present

## 2024-04-02 DIAGNOSIS — D696 Thrombocytopenia, unspecified: Secondary | ICD-10-CM

## 2024-04-02 DIAGNOSIS — Z0000 Encounter for general adult medical examination without abnormal findings: Secondary | ICD-10-CM

## 2024-04-02 DIAGNOSIS — E7800 Pure hypercholesterolemia, unspecified: Secondary | ICD-10-CM | POA: Diagnosis not present

## 2024-04-02 DIAGNOSIS — J411 Mucopurulent chronic bronchitis: Secondary | ICD-10-CM | POA: Diagnosis not present

## 2024-04-02 DIAGNOSIS — Z803 Family history of malignant neoplasm of breast: Secondary | ICD-10-CM

## 2024-04-02 DIAGNOSIS — K219 Gastro-esophageal reflux disease without esophagitis: Secondary | ICD-10-CM | POA: Diagnosis not present

## 2024-04-02 NOTE — Assessment & Plan Note (Signed)
 Overall appears to be doing well.  Follow.

## 2024-04-02 NOTE — Assessment & Plan Note (Signed)
 Last platelet count within normal limits.

## 2024-04-02 NOTE — Assessment & Plan Note (Signed)
 Physical today 04/02/24.  S/p hysterectomy.  Mammogram 09/26/23 - Birads I. Colonoscopy 09/16/21 - non bleeding internal hemorrhoids, otherwise normal.  Recommended f/u in 5 years.

## 2024-04-02 NOTE — Assessment & Plan Note (Signed)
.    She had a another close relative that had breast and pancreatic cancer.  Discussed genetic testing.  She is agreeable.

## 2024-04-02 NOTE — Addendum Note (Signed)
 Addended by: GLENDIA ALLENA RAMAN on: 04/02/2024 02:03 PM   Modules accepted: Orders

## 2024-04-02 NOTE — Assessment & Plan Note (Signed)
 Had intolerance to multiple statin medications.  She is tolerating pravastatin  at 40 mg/day.  She has adjusted her diet.  Lost weight.  Check lipid panel today.

## 2024-04-02 NOTE — Assessment & Plan Note (Signed)
 Previously seeing Dr Alaine.  PFTs 11/2013 - normal.  Saw pulmonary 11/2022 - cxr, exhaled nitric oxide  test and recommended to start Breo. PFT 12/07/22 - normal lung function and increased diffusion capacity.  Reevaluated 12/16/22.  Recommended continue Breo and start flutter valve.  Also continue flonase  and cetirizine.  CT chest 01/13/23 - No evidence of interstitial lung disease. Mild bland postinfectious/postinflammatory scarring in the mid to lower lungs. Moderate patchy air trapping in both lungs, suggesting small airways disease.  Seeing Dr. Aleskerov now.   Is doing better.  No sob.  Has not needed her albuterol  recently.  Overall she feels her breathing is doing well.  Follow.

## 2024-04-02 NOTE — Progress Notes (Addendum)
 Subjective:    [name], female    DOB: Apr 13, 1956, 68 y.o.   MRN: [id number]  Patient here for physical exam.   HPI Here for a physical exam. History of stress and hypercholesterolemia.  On pravastatin . Intolerance to multiple statin medications. Has had history of recurrent bronchitis.  Previously seeing Dr Alaine.  PFTs 11/2013 - normal.  Saw pulmonary 11/2022 - cxr, exhaled nitric oxide  test and recommended to start Breo. PFT 12/07/22 - normal lung function and increased diffusion capacity.  Reevaluated 12/16/22.  Recommended continue Breo and start flutter valve.  Also continue flonase  and cetirizine.  CT chest 01/13/23 - No evidence of interstitial lung disease. Mild bland postinfectious/postinflammatory scarring in the mid to lower lungs. Moderate patchy air trapping in both lungs, suggesting small airways disease. Saw Dr Aleskerov 04/04/24 - recurrent allergic bronchitis. Saw ortho - pain s/p injury - lifting - went to PT. Better now. Doing exercises at home. No chest pain. Breathing doing well. Saw Dr Blair this am. Diagnosed with reflux. Started on omeprazole . Plans to f/u with ENT. Bowels stable.    Past Medical History:  Diagnosis Date   Arthritis    Asthma    Basal cell carcinoma 08/01/2017   Left spinal mid lower back. Nodular.   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 Excised 11/26/2009, margins free.   Past Surgical History:  Procedure Laterality Date   ABDOMINAL HYSTERECTOMY  2004   endometriosis   TONSILLECTOMY  1060's   Family History  Problem Relation Age of Onset   COPD Mother    Cancer Mother        breast and colon   Breast cancer Mother 50   Diabetes Paternal Grandfather    Social History   Socioeconomic History   Marital status: Married    Spouse  name: Not on file   Number of children: Not on file   Years of education: Not on file   Highest education level: Associate degree: occupational, Scientist, product/process development, or vocational program  Occupational History   Not on file  Tobacco Use   Smoking status: Never   Smokeless tobacco: Never  Substance and Sexual Activity   Alcohol use: Yes    Comment: glass of wine occasionally   Drug use: No   Sexual activity: Yes    Birth control/protection: None  Other Topics Concern   Not on file  Social History Narrative   Not on file   Social Drivers of Health   Financial Resource Strain: Low Risk  (04/02/2024)   Overall Financial Resource Strain (CARDIA)    Difficulty of Paying Living Expenses: Not hard at all  Food Insecurity: No Food Insecurity (04/02/2024)   Hunger Vital Sign    Worried About Running Out of Food in the Last Year: Never true    Ran Out of Food in the Last Year: Never true  Transportation Needs: No Transportation Needs (04/02/2024)   PRAPARE - Administrator, Civil Service (Medical): No    Lack of Transportation (Non-Medical): No  Physical Activity: Insufficiently Active (04/02/2024)   Exercise Vital Sign    Days of Exercise per Week: 7 days    Minutes of Exercise per Session: 20 min  Stress: No Stress Concern Present (04/02/2024)  [employer] of Occupational Health - Occupational Stress Questionnaire    Feeling of Stress: Not at all  Social Connections: Socially Integrated (04/02/2024)   Social Connection and Isolation Panel    Frequency of Communication with Friends and Family: More than three times a week    Frequency of Social Gatherings with Friends and Family: Once a week    Attends Religious Services: More than 4 times per year    Active Member of [employer] or Organizations: Yes    Attends Engineer, structural: More than 4 times per year    Marital Status: Married     Review of Systems  Constitutional:  Negative for appetite change and unexpected  weight change.  HENT:  Negative for congestion, sinus pressure and sore throat.   Eyes:  Negative for pain and visual disturbance.  Respiratory:  Negative for cough, chest tightness and shortness of breath.   Cardiovascular:  Negative for chest pain, palpitations and leg swelling.  Gastrointestinal:  Negative for abdominal pain, diarrhea, nausea and vomiting.  Genitourinary:  Negative for difficulty urinating and dysuria.  Musculoskeletal:  Negative for joint swelling and myalgias.  Skin:  Negative for color change and rash.  Neurological:  Negative for dizziness and headaches.  Hematological:  Negative for adenopathy. Does not bruise/bleed easily.  Psychiatric/Behavioral:  Negative for agitation and dysphoric mood.        Objective:     BP 128/74   Pulse 60   Resp 16   Ht 5' 5 (1.651 m)   Wt 155 lb (70.3 kg)   SpO2 98%   BMI 25.79 kg/m  Wt Readings from Last 3 Encounters:  04/02/24 155 lb (70.3 kg)  03/12/24 167 lb (75.8 kg)  10/06/23 167 lb 9.6 oz (76 kg)    Physical Exam Vitals reviewed.  Constitutional:      General: She is not in acute distress.    Appearance: Normal appearance. She is well-developed.  HENT:     Head: Normocephalic and atraumatic.     Right Ear: External ear normal.     Left Ear: External ear normal.     Mouth/Throat:     Pharynx: No oropharyngeal exudate or posterior oropharyngeal erythema.   Eyes:     General: No scleral icterus.       Right eye: No discharge.        Left eye: No discharge.     Conjunctiva/sclera: Conjunctivae normal.   Neck:     Thyroid : No thyromegaly.   Cardiovascular:     Rate and Rhythm: Normal rate and regular rhythm.  Pulmonary:     Effort: No tachypnea, accessory muscle usage or respiratory distress.     Breath sounds: Normal breath sounds. No decreased breath sounds or wheezing.  Chest:  Breasts:    Right: No inverted nipple, mass, nipple discharge or tenderness (no axillary adenopathy).     Left: No  inverted nipple, mass, nipple discharge or tenderness (no axilarry adenopathy).  Abdominal:     General: Bowel sounds are normal.     Palpations: Abdomen is soft.     Tenderness: There is no abdominal tenderness.   Musculoskeletal:        General: No swelling or tenderness.     Cervical back: Neck supple.  Lymphadenopathy:     Cervical: No cervical adenopathy.   Skin:    Findings: No erythema or rash.   Neurological:     Mental Status: She is alert and oriented to person, place, and  time.   Psychiatric:        Mood and Affect: Mood normal.        Behavior: Behavior normal.         Outpatient Encounter Medications as of 04/02/2024  Medication Sig   Calcium  Carbonate-Vitamin D (CALTRATE 600+D PO) Take by mouth.   Multiple Vitamins-Minerals (CENTRUM SILVER PO) Take by mouth.   pravastatin  (PRAVACHOL ) 40 MG tablet TAKE ONE TABLET BY MOUTH DAILY   [DISCONTINUED] albuterol  (VENTOLIN  HFA) 108 (90 Base) MCG/ACT inhaler Inhale 2 puffs into the lungs every 6 (six) hours as needed for wheezing or shortness of breath.   [DISCONTINUED] doxycycline  (VIBRA -TABS) 100 MG tablet Take 1 tablet (100 mg total) by mouth 2 (two) times daily. (Patient not taking: Reported on 03/12/2024)   [DISCONTINUED] fluticasone  (FLONASE ) 50 MCG/ACT nasal spray Place 2 sprays into both nostrils daily.   [DISCONTINUED] fluticasone  furoate-vilanterol (BREO ELLIPTA ) 100-25 MCG/ACT AEPB Inhale 1 puff into the lungs daily.   [DISCONTINUED] predniSONE  (DELTASONE ) 10 MG tablet TAKE 3 TABLETS PO QD FOR 3 DAYS THEN TAKE 2 TABLETS PO QD FOR 3 DAYS THEN TAKE 1 TABLET PO QD FOR 3 DAYS THEN TAKE 1/2 TAB PO QD FOR 3 DAYS (Patient not taking: Reported on 03/12/2024)   No facility-administered encounter medications on file as of 04/02/2024.     Lab Results  Component Value Date   WBC 5.6 11/15/2022   HGB 13.9 11/15/2022   HCT 41.7 11/15/2022   PLT 201.0 11/15/2022   GLUCOSE 89 03/29/2023   CHOL 227 (H) 03/29/2023   TRIG 86.0  03/29/2023   HDL 72.50 03/29/2023   LDLCALC 137 (H) 03/29/2023   ALT 32 03/29/2023   AST 27 03/29/2023   NA 138 03/29/2023   K 4.1 03/29/2023   CL 103 03/29/2023   CREATININE 0.80 03/29/2023   BUN 13 03/29/2023   CO2 29 03/29/2023   TSH 1.69 07/13/2022    MM 3D SCREENING MAMMOGRAM BILATERAL BREAST Result Date: 10/03/2023 CLINICAL DATA:  Screening. EXAM: DIGITAL SCREENING BILATERAL MAMMOGRAM WITH TOMOSYNTHESIS AND CAD TECHNIQUE: Bilateral screening digital craniocaudal and mediolateral oblique mammograms were obtained. Bilateral screening digital breast tomosynthesis was performed. The images were evaluated with computer-aided detection. COMPARISON:  Previous exam(s). ACR Breast Density Category b: There are scattered areas of fibroglandular density. FINDINGS: There are no findings suspicious for malignancy. IMPRESSION: No mammographic evidence of malignancy. A result letter of this screening mammogram will be mailed directly to the patient. RECOMMENDATION: Screening mammogram in one year. (Code:SM-B-01Y) BI-RADS CATEGORY  1: Negative. Electronically Signed   By: Bard Moats M.D.   On: 10/03/2023 16:32       Assessment & Plan:  Routine general medical examination at a health care facility  Healthcare maintenance Assessment & Plan: Physical today 04/02/24.  S/p hysterectomy.  Mammogram 09/26/23 - Birads I. Colonoscopy 09/16/21 - non bleeding internal hemorrhoids, otherwise normal.  Recommended f/u in 5 years.     Hypercholesterolemia Assessment & Plan: Had intolerance to multiple statin medications.  She is tolerating pravastatin  at 40 mg/day.  She has adjusted her diet.  Lost weight.  Check lipid panel today.  Orders: -     Lipid panel -     Hepatic function panel -     Basic metabolic panel with GFR  Hypercholesteremia -     TSH  Thrombocytopenia, unspecified (HCC) Assessment & Plan: Last platelet count within normal limits.    Stress Assessment & Plan: Overall appears  to be doing well.  Follow.  Family history of breast cancer Assessment & Plan: .  She had a another close relative that had breast and pancreatic cancer.  Discussed genetic testing.  She is agreeable.  Orders: -     Ambulatory referral to Genetics  Bronchitis, mucopurulent recurrent [facility]) Assessment & Plan: Previously seeing Dr Alaine.  PFTs 11/2013 - normal.  Saw pulmonary 11/2022 - cxr, exhaled nitric oxide  test and recommended to start Breo. PFT 12/07/22 - normal lung function and increased diffusion capacity.  Reevaluated 12/16/22.  Recommended continue Breo and start flutter valve.  Also continue flonase  and cetirizine.  CT chest 01/13/23 - No evidence of interstitial lung disease. Mild bland postinfectious/postinflammatory scarring in the mid to lower lungs. Moderate patchy air trapping in both lungs, suggesting small airways disease.  Seeing Dr. Aleskerov now.   Is doing better.  No sob.  Has not needed her albuterol  recently.  Overall she feels her breathing is doing well.  Follow.      Allena Hamilton, MD

## 2024-04-03 LAB — HEPATIC FUNCTION PANEL
ALT: 15 U/L (ref 0–35)
AST: 19 U/L (ref 0–37)
Albumin: 4.4 g/dL (ref 3.5–5.2)
Alkaline Phosphatase: 72 U/L (ref 39–117)
Bilirubin, Direct: 0.1 mg/dL (ref 0.0–0.3)
Total Bilirubin: 0.5 mg/dL (ref 0.2–1.2)
Total Protein: 7 g/dL (ref 6.0–8.3)

## 2024-04-03 LAB — BASIC METABOLIC PANEL WITH GFR
BUN: 12 mg/dL (ref 6–23)
CO2: 28 meq/L (ref 19–32)
Calcium: 9.5 mg/dL (ref 8.4–10.5)
Chloride: 102 meq/L (ref 96–112)
Creatinine, Ser: 0.76 mg/dL (ref 0.40–1.20)
GFR: 79.58 mL/min (ref >60.00)
Glucose, Bld: 83 mg/dL (ref 70–99)
Potassium: 4 meq/L (ref 3.5–5.1)
Sodium: 140 meq/L (ref 135–145)

## 2024-04-03 LAB — TSH: TSH: 1.72 u[IU]/mL (ref 0.35–5.50)

## 2024-04-03 LAB — LIPID PANEL
Cholesterol: 207 mg/dL — ABNORMAL HIGH (ref 0–200)
HDL: 68.2 mg/dL (ref >39.00)
LDL Cholesterol: 124 mg/dL — ABNORMAL HIGH (ref 0–99)
NonHDL: 138.71
Total CHOL/HDL Ratio: 3
Triglycerides: 75 mg/dL (ref 0.0–149.0)
VLDL: 15 mg/dL (ref 0.0–40.0)

## 2024-04-04 ENCOUNTER — Ambulatory Visit: Payer: Self-pay | Admitting: Internal Medicine

## 2024-05-09 ENCOUNTER — Other Ambulatory Visit: Payer: Self-pay | Admitting: Internal Medicine

## 2024-05-10 ENCOUNTER — Encounter: Payer: Self-pay | Admitting: Internal Medicine

## 2024-05-11 MED ORDER — ALBUTEROL SULFATE HFA 108 (90 BASE) MCG/ACT IN AERS: 2.0000 | INHALATION_SPRAY | Freq: Four times a day (QID) | RESPIRATORY_TRACT | 1 refills | Status: AC | PRN

## 2024-05-11 MED ORDER — PREDNISONE 10 MG PO TABS: ORAL_TABLET | ORAL | 0 refills | Status: AC

## 2024-05-11 NOTE — Telephone Encounter (Signed)
 Rx sent in for prednisone  and albuterol 

## 2024-05-11 NOTE — Telephone Encounter (Signed)
 Routed to wrong provider. Rerouted to correct provider.

## 2024-05-25 NOTE — Telephone Encounter (Signed)
 Noted. Please let her know that I am out of the office. Confirm doing ok and confirm does not need to be seen. If doing ok, but has any acute change will need to be seen.

## 2024-07-25 ENCOUNTER — Ambulatory Visit: Payer: Self-pay | Admitting: Primary Care

## 2024-07-25 NOTE — Telephone Encounter (Signed)
 FYI Only or Action Required?: FYI only for provider.  Patient is followed in Pulmonology for Bronchitis, last seen on 12/16/2022 by Brenda Leonor HERO, MD.  Called Nurse Triage reporting Cough.  Symptoms began 2 months ago.  Interventions attempted: Rescue inhaler and Increased fluids/rest.  Symptoms are: unchanged.  Triage Disposition: See PCP Within 2 Weeks  Patient/caregiver understands and will follow disposition?:   E2C2 Pulmonary Triage - Initial Assessment Questions "Chief Complaint (e.g., cough, sob, wheezing, fever, chills, sweat or additional symptoms) *Go to specific symptom protocol after initial questions. Patient calling with concerns for cough for the last two months. Patient endorses no SOB, fever, chills. Patient states she can cough multiple times a day. Patient requesting to be seen. Scheduled for 07/30/2024 at 9 AM per her request.  "How long have symptoms been present?" 2 months ago  Have you tested for COVID or Flu? Note: If not, ask patient if a home test can be taken. If so, instruct patient to call back for positive results. No  MEDICINES:   "Have you used any OTC meds to help with symptoms?" No If yes, ask "What medications?"  "Have you used your inhalers/maintenance medication?" Yes If yes, "What medications?" Albuterol  inhaler  If inhaler, ask "How many puffs and how often?" Note: Review instructions on medication in the chart. Albuterol  2 puffs Q6H  OXYGEN: "Do you wear supplemental oxygen?" No If yes, "How many liters are you supposed to use?"  "Do you monitor your oxygen levels?" No If yes, What is your reading (oxygen level) today?   What is your usual oxygen saturation reading?  (Note: Pulmonary O2 sats should be 90% or greater)   Copied from CRM #[id number]. Topic: Clinical - Red Word Triage >> Jul 25, 2024  4:28 PM Rilla B wrote: Red Word that prompted transfer to Nurse Triage:  Persistent coughing, wheezing, Reason for  Disposition  Cough lasts > 3 weeks  Answer Assessment - Initial Assessment Questions 1. ONSET: When did the cough begin?      Started almost two months again 2. SEVERITY: How bad is the cough today?      moderate 3. SPUTUM: Describe the color of your sputum (e.g., none, dry cough; clear, white, yellow, green)     Mostly white thick sputum.  4. HEMOPTYSIS: Are you coughing up any blood? If so ask: How much? (e.g., flecks, streaks, tablespoons, etc.)     no 5. DIFFICULTY BREATHING: Are you having difficulty breathing? If Yes, ask: How bad is it? (e.g., mild, moderate, severe)      No difficulty breathing 6. FEVER: Do you have a fever? If Yes, ask: What is your temperature, how was it measured, and when did it start?     no 7. CARDIAC HISTORY: Do you have any history of heart disease? (e.g., heart attack, congestive heart failure)      yes 8. LUNG HISTORY: Do you have any history of lung disease?  (e.g., pulmonary embolus, asthma, emphysema)     Hx of wheezing and severe bronchitis 9. PE RISK FACTORS: Do you have a history of blood clots? (or: recent major surgery, recent prolonged travel, bedridden)     no 10. OTHER SYMPTOMS: Do you have any other symptoms? (e.g., runny nose, wheezing, chest pain)       no 12. TRAVEL: Have you traveled out of the country in the last month? (e.g., travel history, exposures)       no  Protocols used: Cough - Chronic-A-AH

## 2024-07-30 ENCOUNTER — Encounter: Payer: Self-pay | Admitting: Pulmonary Disease

## 2024-07-30 ENCOUNTER — Other Ambulatory Visit: Payer: Self-pay | Admitting: Pulmonary Disease

## 2024-07-30 ENCOUNTER — Ambulatory Visit: Admitting: Pulmonary Disease

## 2024-07-30 VITALS — BP 172/81 | HR 60 | Ht 65.0 in | Wt 156.4 lb

## 2024-07-30 DIAGNOSIS — R053 Chronic cough: Secondary | ICD-10-CM | POA: Diagnosis not present

## 2024-07-30 DIAGNOSIS — J309 Allergic rhinitis, unspecified: Secondary | ICD-10-CM | POA: Diagnosis not present

## 2024-07-30 DIAGNOSIS — R062 Wheezing: Secondary | ICD-10-CM | POA: Diagnosis not present

## 2024-07-30 DIAGNOSIS — R059 Cough, unspecified: Secondary | ICD-10-CM

## 2024-07-30 MED ORDER — FLUTICASONE FUROATE-VILANTEROL 100-25 MCG/ACT IN AEPB
1.0000 | INHALATION_SPRAY | Freq: Every day | RESPIRATORY_TRACT | 5 refills | Status: AC
Start: 2024-07-30 — End: ?

## 2024-07-30 MED ORDER — PREDNISONE 20 MG PO TABS: ORAL_TABLET | ORAL | 0 refills | Status: AC

## 2024-07-30 MED ORDER — AZITHROMYCIN 250 MG PO TABS: ORAL_TABLET | ORAL | 0 refills | Status: AC

## 2024-07-30 NOTE — Progress Notes (Signed)
 Brenda Burns    [id number]    [birth year]  Primary Care Physician:Scott, Allena, MD  Referring Physician: Glendia Allena, MD [address] [place],  [place] [address]  Chief complaint:  recurrent bronchitis  Discussed the use of AI scribe software for clinical note transcription with the patient, who gave verbal consent to proceed.  History of Present Illness Brenda Burns is a 68 year old female with a history of bronchitis who presents with shortness of breath, wheezing, and persistent cough.  For the past month and a half, she has experienced shortness of breath, wheezing, and a persistent cough severe enough to disrupt her sleep. The cough produces phlegm with a foul taste, raising concerns about a possible infection. She describes this episode as the worst she has experienced, with constant wheezing and a severe cough.  She previously took leftover prednisone , which temporarily improved her symptoms, but they returned more severely. Her husband suggested that the dose was insufficient and that she should have sought medical attention earlier. In the past, she has been treated with antibiotics and a longer course of prednisone , which she found effective. 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  She is currently using albuterol  as needed but is not on any other regular medication for her respiratory symptoms. Her eosinophil levels were previously high, but she has not had a recent breathing study. Prednisone  affects her sleep, so she takes it in the morning. She has not used antibiotics in the past six to seven months.  Her mother had COPD and advised her to take care of her respiratory health, which she has been mindful of. She has never smoked and worked in a non-smoking environment, with no significant exposure to secondhand smoke. She takes an allergy pill daily and has  no known allergies to medications except for penicillin.   Outpatient Encounter Medications as of 07/30/2024  Medication Sig   albuterol  (VENTOLIN  HFA) 108 (90 Base) MCG/ACT inhaler Inhale 2 puffs into the lungs every 6 (six) hours as needed for wheezing or shortness of breath.   Calcium  Carbonate-Vitamin D (CALTRATE 600+D PO) Take by mouth.   Multiple Vitamins-Minerals (CENTRUM SILVER PO) Take by mouth.   pravastatin  (PRAVACHOL ) 40 MG tablet TAKE 1 TABLET BY MOUTH ONCE DAILY   predniSONE  (DELTASONE ) 10 MG tablet Take 6 tablets x 1 day and then decrease by 1/2 tablet per day until down to zero mg. (Patient not taking: Reported on 07/30/2024)   No facility-administered encounter medications on file as of 07/30/2024.    Allergies as of 07/30/2024 - Review Complete 07/30/2024  Allergen Reaction Noted   Penicillins  10/29/2013    Past Medical History:  Diagnosis Date   Arthritis    Asthma    Basal cell carcinoma 08/01/2017   Left spinal mid lower back. Nodular.   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 Excised 11/26/2009, margins free.    Past Surgical History:  Procedure Laterality Date   ABDOMINAL HYSTERECTOMY  2004   endometriosis   TONSILLECTOMY  1060's    Family History  Problem Relation Age of Onset   COPD Mother    Cancer Mother  breast and colon   Breast cancer Mother 60   Diabetes Paternal Grandfather     Social History   Socioeconomic History   Marital status: Married    Spouse name: Not on file   Number of children: Not on file   Years of education: Not on file   Highest education level: Associate degree: occupational, scientist, product/process development, or vocational program  Occupational History   Not on file  Tobacco Use   Smoking status: Never   Smokeless tobacco: Never   Substance and Sexual Activity   Alcohol use: Yes    Comment: glass of wine occasionally   Drug use: No   Sexual activity: Yes    Birth control/protection: None  Other Topics Concern   Not on file  Social History Narrative   Not on file   Social Drivers of Health   Financial Resource Strain: Low Risk  (04/02/2024)   Overall Financial Resource Strain (CARDIA)    Difficulty of Paying Living Expenses: Not hard at all  Food Insecurity: No Food Insecurity (04/02/2024)   Hunger Vital Sign    Worried About Running Out of Food in the Last Year: Never true    Ran Out of Food in the Last Year: Never true  Transportation Needs: No Transportation Needs (04/02/2024)   PRAPARE - Administrator, Civil Service (Medical): No    Lack of Transportation (Non-Medical): No  Physical Activity: Insufficiently Active (04/02/2024)   Exercise Vital Sign    Days of Exercise per Week: 7 days    Minutes of Exercise per Session: 20 min  Stress: No Stress Concern Present (04/02/2024)   [employer] of Occupational Health - Occupational Stress Questionnaire    Feeling of Stress: Not at all  Social Connections: Socially Integrated (04/02/2024)   Social Connection and Isolation Panel    Frequency of Communication with Friends and Family: More than three times a week    Frequency of Social Gatherings with Friends and Family: Once a week    Attends Religious Services: More than 4 times per year    Active Member of [employer] or Organizations: Yes    Attends Engineer, Structural: More than 4 times per year    Marital Status: Married  Catering Manager Violence: Not At Risk (03/12/2024)   Humiliation, Afraid, Rape, and Kick questionnaire    Fear of Current or Ex-Partner: No    Emotionally Abused: No    Physically Abused: No    Sexually Abused: No    Review of Systems  Respiratory:  Positive for cough and shortness of breath.   Psychiatric/Behavioral:  Negative for sleep disturbance.      Vitals:   07/30/24 0842  BP: (!) 172/81  Pulse: 60  SpO2: 97%     Physical Exam Constitutional:      Appearance: Normal appearance.  HENT:     Head: Normocephalic.     Mouth/Throat:     Mouth: Mucous membranes are moist.  Eyes:     General: No scleral icterus. Cardiovascular:     Rate and Rhythm: Normal rate and regular rhythm.     Heart sounds: No murmur heard.    No friction rub.  Pulmonary:     Effort: No respiratory distress.     Breath sounds: No stridor. No wheezing or rhonchi.  Musculoskeletal:     Cervical back: No rigidity or tenderness.  Neurological:     Mental Status: She is alert.  Psychiatric:  Mood and Affect: Mood normal.    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 She experiences bronchitis annually, but this episode is more severe and persistent. Temporary relief with leftover prednisone , but symptoms recurred. 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 Long-term inhaler use is recommended to stabilize the airway and prevent exacerbations. Discussed risks of long-term medication; inhaled steroids have lower risk than repeated oral prednisone . Using Breo daily for a year equates to systemic exposure of 40 mg prednisone  for 7 days, which is lower risk than frequent prednisone  courses. - Prescribe Breo inhaler for daily use - Prescribe prednisone  40 mg daily for 5 days, then 20 mg daily for 5 days, then discontinue - Prescribe an antibiotic, avoiding  penicillin due to allergy - Consider future pulmonary function testing - Monitor symptoms and adjust treatment as needed  Allergic rhinitis Uses a daily antihistamine, indicating allergic rhinitis. Discussed potential use of Singulair to manage symptoms, especially during fall when symptoms may worsen due to environmental triggers. Advised consistent use of Singulair if chosen. - Continue daily antihistamine - Consider prescribing Singulair for consistent use, especially during the fall season  Family history of COPD Concerned about developing COPD due to family history; mother died from the condition. Reassured that COPD is not known to be inheritable, but acknowledged possibility of unknown genetic factors. Advised to maintain airway function with inhalers to prevent progression to COPD. - Reassure about COPD inheritance - Emphasize importance of maintaining airway function with inhalers  Follow-up Follow-up in 8 to 12 weeks to assess treatment response and determine need for further evaluation or adjustments. - Schedule follow-up appointment in 8 to 12 weeks  Encouraged to call with significant concerns Jennet Epley MD [facility] 07/30/2024, 9:04 AM  CC: Glendia Shad, MD

## 2024-07-30 NOTE — Progress Notes (Unsigned)
 Brenda Burns    [id number]    October 03, 1956  Primary Care Physician:Scott, Allena, MD  Referring Physician: No referring provider defined for this encounter.  Chief complaint:  ***  Discussed the use of AI scribe software for clinical note transcription with the patient, who gave verbal consent to proceed.  History of Present Illness      Pets: Occupation: Exposures: Smoking history: Travel history: Relevant family history:  Outpatient Encounter Medications as of 07/30/2024  Medication Sig   azithromycin  (ZITHROMAX  Z-PAK) 250 MG tablet Take 2 tablets day 1 and then 1 daily for 4 days   fluticasone  furoate-vilanterol (BREO ELLIPTA ) 100-25 MCG/ACT AEPB Inhale 1 puff into the lungs daily.   predniSONE  (DELTASONE ) 20 MG tablet 40 mg daily for 5 days then 20 mg daily for 5 days   albuterol  (VENTOLIN  HFA) 108 (90 Base) MCG/ACT inhaler Inhale 2 puffs into the lungs every 6 (six) hours as needed for wheezing or shortness of breath.   Calcium  Carbonate-Vitamin D (CALTRATE 600+D PO) Take by mouth.   Multiple Vitamins-Minerals (CENTRUM SILVER PO) Take by mouth.   pravastatin  (PRAVACHOL ) 40 MG tablet TAKE 1 TABLET BY MOUTH ONCE DAILY   predniSONE  (DELTASONE ) 10 MG tablet Take 6 tablets x 1 day and then decrease by 1/2 tablet per day until down to zero mg. (Patient not taking: Reported on 07/30/2024)   No facility-administered encounter medications on file as of 07/30/2024.    Allergies as of 07/30/2024 - Review Complete 07/30/2024  Allergen Reaction Noted   Penicillins  10/29/2013    Past Medical History:  Diagnosis Date   Arthritis    Asthma    Basal cell carcinoma 08/01/2017   Left spinal mid lower back. Nodular.   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 Excised  11/26/2009, margins free.    Past Surgical History:  Procedure Laterality Date   ABDOMINAL HYSTERECTOMY  2004   endometriosis   TONSILLECTOMY  1060's    Family History  Problem Relation Age of Onset   COPD Mother    Cancer Mother        breast and colon   Breast cancer Mother 78   Diabetes Paternal Grandfather     Social History   Socioeconomic History   Marital status: Married    Spouse name: Not on file   Number of children: Not on file   Years of education: Not on file   Highest education level: Associate degree: occupational, scientist, product/process development, or vocational program  Occupational History   Not on file  Tobacco Use   Smoking status: Never   Smokeless tobacco: Never  Substance and Sexual Activity   Alcohol use: Yes    Comment: glass of wine occasionally   Drug use: No   Sexual activity: Yes    Birth control/protection: None  Other Topics Concern   Not on file  Social History Narrative   Not on file   Social Drivers of Health   Financial Resource Strain: Low Risk  (04/02/2024)   Overall Financial Resource Strain (CARDIA)    Difficulty of Paying Living Expenses: Not hard at all  Food Insecurity: No Food Insecurity (04/02/2024)   Hunger Vital Sign    Worried About Programme Researcher, Broadcasting/film/video in  the Last Year: Never true    Ran Out of Food in the Last Year: Never true  Transportation Needs: No Transportation Needs (04/02/2024)   PRAPARE - Administrator, Civil Service (Medical): No    Lack of Transportation (Non-Medical): No  Physical Activity: Insufficiently Active (04/02/2024)   Exercise Vital Sign    Days of Exercise per Week: 7 days    Minutes of Exercise per Session: 20 min  Stress: No Stress Concern Present (04/02/2024)   [employer] of Occupational Health - Occupational Stress Questionnaire    Feeling of Stress: Not at all  Social Connections: Socially Integrated (04/02/2024)   Social Connection and Isolation Panel    Frequency of Communication with  Friends and Family: More than three times a week    Frequency of Social Gatherings with Friends and Family: Once a week    Attends Religious Services: More than 4 times per year    Active Member of [employer] or Organizations: Yes    Attends Engineer, Structural: More than 4 times per year    Marital Status: Married  Catering Manager Violence: Not At Risk (03/12/2024)   Humiliation, Afraid, Rape, and Kick questionnaire    Fear of Current or Ex-Partner: No    Emotionally Abused: No    Physically Abused: No    Sexually Abused: No    Review of Systems  There were no vitals filed for this visit.   Physical Exam   Data Reviewed: ***    Assessment and Plan Assessment & Plan      No orders of the defined types were placed in this encounter.     Jennet Epley MD [facility] 07/30/2024, 9:04 AM  CC: No ref. provider found

## 2024-07-30 NOTE — Patient Instructions (Signed)
 I will see you back in about 8 to 12 weeks  Prednisone  40 mg daily for 5 days then 20 mg daily for 5 days  Azithromycin  for 5 days  Breo 100, 1 puff daily, ensure you are rinsing your mouth following use  Use albuterol  as needed  We will consider a breathing study, blood work in the near future depending on how your symptoms stabilize  Call with concerns

## 2024-08-27 ENCOUNTER — Other Ambulatory Visit: Payer: Self-pay | Admitting: Internal Medicine

## 2024-08-27 DIAGNOSIS — Z1231 Encounter for screening mammogram for malignant neoplasm of breast: Secondary | ICD-10-CM

## 2024-09-07 ENCOUNTER — Ambulatory Visit: Admitting: Podiatry

## 2024-09-18 ENCOUNTER — Ambulatory Visit: Admitting: Podiatry

## 2024-09-18 ENCOUNTER — Encounter: Payer: Self-pay | Admitting: Podiatry

## 2024-09-18 ENCOUNTER — Ambulatory Visit

## 2024-09-18 VITALS — Ht 65.0 in | Wt 156.4 lb

## 2024-09-18 DIAGNOSIS — M722 Plantar fascial fibromatosis: Secondary | ICD-10-CM

## 2024-09-18 MED ORDER — BETAMETHASONE SOD PHOS & ACET 6 (3-3) MG/ML IJ SUSP
3.0000 mg | Freq: Once | INTRAMUSCULAR | Status: AC
  Administered 2024-09-18: 15:00:00 3 mg via INTRA_ARTICULAR

## 2024-09-18 MED ORDER — METHYLPREDNISOLONE 4 MG PO TBPK: ORAL_TABLET | ORAL | 0 refills | Status: AC

## 2024-09-18 MED ORDER — MELOXICAM 15 MG PO TABS: 15.0000 mg | ORAL_TABLET | Freq: Every day | ORAL | 1 refills | Status: AC

## 2024-09-18 NOTE — Progress Notes (Signed)
° °  Chief Complaint  Patient presents with   Foot Pain    Pt is here due to left heel pain, she states this began a few months ago, pain is off and on, no injury to the foot, thinks she may have plantar fasciitis.    Subjective: 68 y.o. female presenting for above complaint   Past Medical History:  Diagnosis Date   Arthritis    Asthma    Basal cell carcinoma 08/01/2017   Left spinal mid lower back. Nodular.   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 Excised 11/26/2009, margins free.     Objective: Physical Exam General: The patient is alert and oriented x3 in no acute distress.  Dermatology: Skin is warm, dry and supple bilateral lower extremities. Negative for open lesions or macerations bilateral.   Vascular: Dorsalis Pedis and Posterior Tibial pulses palpable bilateral.  Capillary fill time is immediate to all digits.  Neurological: Epicritic and protective threshold intact bilateral.   Musculoskeletal: Tenderness to palpation to the plantar aspect of the left heel along the plantar fascia. All other joints range of motion within normal limits bilateral. Strength 5/5 in all groups bilateral.   Radiographic exam LT foot 09/18/2024: Normal osseous mineralization. Joint spaces preserved. No fracture/dislocation/boney destruction. No other soft tissue abnormalities or radiopaque foreign bodies.  Impression: Negative  Assessment: 1. Plantar fasciitis left foot  Plan of Care:  1. Patient evaluated. Xrays reviewed.   2. Injection of 0.5cc Celestone  soluspan injected into the left plantar fascia.  3. Rx for Medrol  Dose Pak placed 4. Rx for Meloxicam  ordered for patient. 5.  Advised against going barefoot.  Recommend good supportive tennis shoes and sneakers 6. Instructed patient regarding  therapies and modalities at home to alleviate symptoms.  7. Return to clinic in 4 weeks.     Thresa EMERSON Sar, DPM [facility]  Dr. Thresa EMERSON Sar, DPM    [address] [place], [place] [address]                Office [phone]  Fax [phone]

## 2024-10-02 ENCOUNTER — Ambulatory Visit
Admission: RE | Admit: 2024-10-02 | Discharge: 2024-10-02 | Disposition: A | Discharge status: Home / Self Care | Source: Ambulatory Visit | Attending: Internal Medicine | Admitting: Internal Medicine

## 2024-10-02 DIAGNOSIS — Z1231 Encounter for screening mammogram for malignant neoplasm of breast: Secondary | ICD-10-CM | POA: Insufficient documentation

## 2024-10-11 ENCOUNTER — Ambulatory Visit: Admitting: Pulmonary Disease

## 2024-10-23 ENCOUNTER — Ambulatory Visit: Admitting: Podiatry

## 2024-10-25 ENCOUNTER — Ambulatory Visit: Admitting: Primary Care

## 2024-11-06 ENCOUNTER — Ambulatory Visit: Admitting: Nurse Practitioner

## 2024-11-06 ENCOUNTER — Ambulatory Visit: Payer: Self-pay | Admitting: Nurse Practitioner

## 2024-11-06 ENCOUNTER — Ambulatory Visit: Payer: Self-pay

## 2024-11-06 ENCOUNTER — Ambulatory Visit
Admission: RE | Admit: 2024-11-06 | Discharge: 2024-11-06 | Disposition: A | Source: Ambulatory Visit | Attending: Nurse Practitioner

## 2024-11-06 ENCOUNTER — Ambulatory Visit
Admission: RE | Admit: 2024-11-06 | Discharge: 2024-11-06 | Disposition: A | Source: Home / Self Care | Attending: Nurse Practitioner | Admitting: Nurse Practitioner

## 2024-11-06 ENCOUNTER — Encounter: Payer: Self-pay | Admitting: Nurse Practitioner

## 2024-11-06 VITALS — BP 148/84 | HR 60 | Temp 97.8°F | Ht 65.0 in | Wt 155.6 lb

## 2024-11-06 DIAGNOSIS — J189 Pneumonia, unspecified organism: Secondary | ICD-10-CM

## 2024-11-06 DIAGNOSIS — J069 Acute upper respiratory infection, unspecified: Secondary | ICD-10-CM

## 2024-11-06 DIAGNOSIS — R062 Wheezing: Secondary | ICD-10-CM

## 2024-11-06 LAB — POCT INFLUENZA A/B
Influenza A, POC: NEGATIVE
Influenza B, POC: NEGATIVE

## 2024-11-06 LAB — POC COVID19 BINAXNOW: SARS Coronavirus 2 Ag: NEGATIVE

## 2024-11-06 MED ORDER — PREDNISONE 20 MG PO TABS: 40.0000 mg | ORAL_TABLET | Freq: Every day | ORAL | 0 refills | Status: DC

## 2024-11-06 MED ORDER — DOXYCYCLINE HYCLATE 100 MG PO TABS: 100.0000 mg | ORAL_TABLET | Freq: Two times a day (BID) | ORAL | 0 refills | Status: AC

## 2024-11-06 MED ORDER — PREDNISONE 20 MG PO TABS: 40.0000 mg | ORAL_TABLET | Freq: Every day | ORAL | 0 refills | Status: AC

## 2024-11-06 MED ORDER — DOXYCYCLINE HYCLATE 100 MG PO TABS: 100.0000 mg | ORAL_TABLET | Freq: Two times a day (BID) | ORAL | 0 refills | Status: DC

## 2024-11-06 NOTE — Patient Instructions (Signed)
 [facility] at [facility] [address] [place], [place] [address] [phone]

## 2024-11-06 NOTE — Telephone Encounter (Signed)
 Please call and confirm she is doing ok. She was evaluated by [facility] 11/06/24. Diagnosed with pneumonia. Please call and see how she is doing. Needs to keep us  posted.

## 2024-11-06 NOTE — Assessment & Plan Note (Addendum)
 Bilateral wheezing with chest tightness. POCT Flu and COVID negative. Orders:   DG Chest 2 View   POC COVID-19 BinaxNow   POCT Influenza A/B

## 2024-11-06 NOTE — Progress Notes (Signed)
 Called and discussed the result with Pt. Pt has Pneumonia and will treat with doxycyline 100 mg BID X 10 days and prednisone  40 mg once a day for 5 day.  Close follow up.

## 2024-11-08 ENCOUNTER — Emergency Department
Admission: EM | Admit: 2024-11-08 | Discharge: 2024-11-08 | Disposition: A | Discharge status: Home / Self Care | Attending: Emergency Medicine | Admitting: Emergency Medicine

## 2024-11-08 ENCOUNTER — Emergency Department

## 2024-11-08 ENCOUNTER — Other Ambulatory Visit: Payer: Self-pay

## 2024-11-08 ENCOUNTER — Telehealth: Payer: Self-pay

## 2024-11-08 DIAGNOSIS — J189 Pneumonia, unspecified organism: Secondary | ICD-10-CM | POA: Insufficient documentation

## 2024-11-08 DIAGNOSIS — J9801 Acute bronchospasm: Secondary | ICD-10-CM | POA: Insufficient documentation

## 2024-11-08 LAB — CBC
HCT: 40 % (ref 36.0–46.0)
Hemoglobin: 13.7 g/dL (ref 12.0–15.0)
MCH: 29.5 pg (ref 26.0–34.0)
MCHC: 34.3 g/dL (ref 30.0–36.0)
MCV: 86.2 fL (ref 80.0–100.0)
Platelets: 214 10*3/uL (ref 150–400)
RBC: 4.64 MIL/uL (ref 3.87–5.11)
RDW: 13 % (ref 11.5–15.5)
WBC: 5.6 10*3/uL (ref 4.0–10.5)
nRBC: 0 % (ref 0.0–0.2)

## 2024-11-08 LAB — BASIC METABOLIC PANEL WITH GFR
Anion gap: 13 (ref 5–15)
BUN: 20 mg/dL (ref 8–23)
CO2: 24 mmol/L (ref 22–32)
Calcium: 8.9 mg/dL (ref 8.9–10.3)
Chloride: 103 mmol/L (ref 98–111)
Creatinine, Ser: 0.88 mg/dL (ref 0.44–1.00)
GFR, Estimated: >60 mL/min
Glucose, Bld: 117 mg/dL — ABNORMAL HIGH (ref 70–99)
Potassium: 4.1 mmol/L (ref 3.5–5.1)
Sodium: 139 mmol/L (ref 135–145)

## 2024-11-08 LAB — TROPONIN T, HIGH SENSITIVITY: Troponin T High Sensitivity: <6 ng/L (ref 0–19)

## 2024-11-08 MED ORDER — IPRATROPIUM-ALBUTEROL 0.5-2.5 (3) MG/3ML IN SOLN
3.0000 mL | Freq: Once | RESPIRATORY_TRACT | Status: AC
  Administered 2024-11-08: 3 mL via RESPIRATORY_TRACT
  Filled 2024-11-08: qty 3

## 2024-11-08 NOTE — Telephone Encounter (Signed)
 Called Patient and advised to go to the ED per Chelsea Aurora.

## 2024-11-08 NOTE — Assessment & Plan Note (Addendum)
 Chest Xray showed bilateral lower lobe pneumonia.  -Will treat with doxycyline and prednisone . -Close follow up.

## 2024-11-08 NOTE — ED Triage Notes (Signed)
 Pt reports being recently diagnosed with double pna. Has had 2 days of abx and prednisone . Pt reports she feels like a band is around her chest squeezing.

## 2024-11-08 NOTE — Telephone Encounter (Signed)
 I would recommend pt to go to the ED today given her symptoms are worsening while on medication.

## 2024-11-08 NOTE — ED Provider Notes (Signed)
 "  [facility] Provider Note    Event Date/Time   First MD Initiated Contact with Patient 11/08/24 2000     (approximate)   History   Pneumonia   HPI  Brenda Burns is a 69 y.o. female recently treated for pneumonia who presents with complaints of chest tightness, she describes a bandlike sensation in her lower chest.  She does have a history of chronic bronchitis.  No fevers,     Physical Exam   Triage Vital Signs: ED Triage Vitals  Encounter Vitals Group     BP 11/08/24 1615 (!) 146/76     Girls Systolic BP Percentile --      Girls Diastolic BP Percentile --      Boys Systolic BP Percentile --      Boys Diastolic BP Percentile --      Pulse Rate 11/08/24 1615 72     Resp 11/08/24 1615 19     Temp 11/08/24 1615 98.1 F (36.7 C)     Temp Source 11/08/24 1959 Oral     SpO2 11/08/24 1615 100 %     Weight 11/08/24 1616 70.5 kg (155 lb 6.8 oz)     Height 11/08/24 1616 1.651 m (5' 5)     Head Circumference --      Peak Flow --      Pain Score 11/08/24 1616 7     Pain Loc --      Pain Education --      Exclude from Growth Chart --     Most recent vital signs: Vitals:   11/08/24 2105 11/08/24 2112  BP:  133/84  Pulse: 63 78  Resp:  17  Temp:    SpO2: 100% 100%     General: Awake, no distress.  CV:  Good peripheral perfusion.  Resp:  Normal effort.  Scattered wheezing on exam Abd:  No distention.  Other:     ED Results / Procedures / Treatments   Labs (all labs ordered are listed, but only abnormal results are displayed) Labs Reviewed  BASIC METABOLIC PANEL WITH GFR - Abnormal; Notable for the following components:      Result Value   Glucose, Bld 117 (*)    All other components within normal limits  CBC  TROPONIN T, HIGH SENSITIVITY  TROPONIN T, HIGH SENSITIVITY     EKG  ED ECG REPORT I, Lamar Price, the attending physician, personally viewed and interpreted this ECG.  Date: 11/08/2024  Rhythm: normal sinus  rhythm QRS Axis: normal Intervals: normal ST/T Wave abnormalities: normal Narrative Interpretation: no evidence of acute ischemia    RADIOLOGY Chest x-ray viewed interpret by me, appears improved from prior, confirmed radiology    PROCEDURES:  Critical Care performed:   Procedures   MEDICATIONS ORDERED IN ED: Medications  ipratropium-albuterol  (DUONEB) 0.5-2.5 (3) MG/3ML nebulizer solution 3 mL (3 mLs Nebulization Given 11/08/24 2044)  ipratropium-albuterol  (DUONEB) 0.5-2.5 (3) MG/3ML nebulizer solution 3 mL (3 mLs Nebulization Given 11/08/24 2028)     IMPRESSION / MDM / ASSESSMENT AND PLAN / ED COURSE  I reviewed the triage vital signs and the nursing notes. Patient's presentation is most consistent with acute presentation with potential threat to life or bodily function.  Patient presents with symptoms as detailed above, differential includes worsening pneumonia, bronchospasm, doubt pneumothorax  On exam she has significant wheezing, however her chest x-ray looks improved from prior, she is reassured to hear this.  Lab work is overall reassuring  Treated  with DuoNebs with significant improvement  She longer feels any bandlike discomfort, I suspect mild bronchospasm as the cause, appropriate for discharge no indication for admission at this time      FINAL CLINICAL IMPRESSION(S) / ED DIAGNOSES   Final diagnoses:  Bronchospasm     Rx / DC Orders   ED Discharge Orders     None        Note:  This document was prepared using Dragon voice recognition software and may include unintentional dictation errors.   Arlander Charleston, MD 11/08/24 2218  "

## 2024-11-08 NOTE — Telephone Encounter (Signed)
 Patient states I can not wait til tomorrow to come in to your office because I am Not getting better. I do not have a bra on today due to my chest hurting so bad it feels like a belt around my chest tight as it can go. I just keep gurgling because it is so deep in my chest. I started to go to the ED earlier due to coughing and hurting so bad.

## 2024-11-09 ENCOUNTER — Encounter: Payer: Self-pay | Admitting: Nurse Practitioner

## 2024-11-09 ENCOUNTER — Ambulatory Visit: Admitting: Nurse Practitioner

## 2024-11-09 MED ORDER — IPRATROPIUM-ALBUTEROL 0.5-2.5 (3) MG/3ML IN SOLN: 3.0000 mL | Freq: Four times a day (QID) | RESPIRATORY_TRACT | 1 refills | Status: AC | PRN

## 2024-11-09 MED ORDER — FLUTICASONE PROPIONATE 50 MCG/ACT NA SUSP: 2.0000 | Freq: Every day | NASAL | 1 refills | Status: AC

## 2024-11-09 NOTE — Progress Notes (Unsigned)
 "  Established Patient Office Visit  Subjective:  [name], female    DOB: 09-21-1956  Age: 69 y.o. MRN: [id number]  CC:  Chief Complaint  Patient presents with   Acute Visit    Evaluation of double pneumonia   Discussed the use of AI scribe software for clinical note transcription with the patient, who gave verbal consent to proceed.  History of Present Illness     Past Medical History:  Diagnosis Date   Arthritis    Asthma    Basal cell carcinoma 08/01/2017   Left spinal mid lower back. Nodular.   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 Excised 11/26/2009, margins free.    Past Surgical History:  Procedure Laterality Date   ABDOMINAL HYSTERECTOMY  2004   endometriosis   TONSILLECTOMY  1060's    Family History  Problem Relation Age of Onset   COPD Mother    Cancer Mother        breast and colon   Breast cancer Mother 80   Diabetes Paternal Grandfather     Social History   Socioeconomic History   Marital status: Married    Spouse name: Not on file   Number of children: Not on file   Years of education: Not on file   Highest education level: Associate degree: occupational, scientist, product/process development, or vocational program  Occupational History   Not on file  Tobacco Use   Smoking status: Never   Smokeless tobacco: Never  Substance and Sexual Activity   Alcohol use: Yes    Comment: glass of wine occasionally   Drug use: No   Sexual activity: Yes    Birth control/protection: None  Other Topics Concern   Not on file  Social History Narrative   Not on file   Social Drivers of Health   Tobacco Use: Low Risk (11/09/2024)   Patient History    Smoking Tobacco Use: Never    Smokeless Tobacco Use: Never    Passive Exposure: Not on file   Financial Resource Strain: Low Risk (04/02/2024)   Overall Financial Resource Strain (CARDIA)    Difficulty of Paying Living Expenses: Not hard at all  Food Insecurity: No Food Insecurity (04/02/2024)   Epic    Worried About Radiation Protection Practitioner of Food in the Last Year: Never true    Ran Out of Food in the Last Year: Never true  Transportation Needs: No Transportation Needs (04/02/2024)   Epic    Lack of Transportation (Medical): No    Lack of Transportation (Non-Medical): No  Physical Activity: Insufficiently Active (04/02/2024)   Exercise Vital Sign    Days of Exercise per Week: 7 days    Minutes of Exercise per Session: 20 min  Stress: No Stress Concern Present (04/02/2024)   [employer] of Occupational Health - Occupational Stress Questionnaire    Feeling of Stress: Not at all  Social Connections: Socially Integrated (04/02/2024)   Social Connection and Isolation Panel    Frequency of Communication with Friends and Family: More than three times a week    Frequency of Social Gatherings with Friends and Family: Once a week    Attends Religious Services: More than 4 times per year    Active Member of [employer] or Organizations: Yes  Attends Banker Meetings: More than 4 times per year    Marital Status: Married  Catering Manager Violence: Not At Risk (03/12/2024)   Humiliation, Afraid, Rape, and Kick questionnaire    Fear of Current or Ex-Partner: No    Emotionally Abused: No    Physically Abused: No    Sexually Abused: No  Depression (PHQ2-9): Low Risk (11/06/2024)   Depression (PHQ2-9)    PHQ-2 Score: 4  Alcohol Screen: Low Risk (04/02/2024)   Alcohol Screen    Last Alcohol Screening Score (AUDIT): 1  Housing: Low Risk (04/02/2024)   Epic    Unable to Pay for Housing in the Last Year: No    Number of Times Moved in the Last Year: 0    Homeless in the Last Year: No  Utilities: Not At Risk (03/12/2024)   AHC Utilities    Threatened with loss of  utilities: No  Health Literacy: Adequate Health Literacy (03/12/2024)   B1300 Health Literacy    Frequency of need for help with medical instructions: Never     Outpatient Medications Prior to Visit  Medication Sig Dispense Refill   albuterol  (VENTOLIN  HFA) 108 (90 Base) MCG/ACT inhaler Inhale 2 puffs into the lungs every 6 (six) hours as needed for wheezing or shortness of breath. 18 g 1   azithromycin  (ZITHROMAX  Z-PAK) 250 MG tablet Take 2 tablets day 1 and then 1 daily for 4 days 6 each 0   Calcium  Carbonate-Vitamin D (CALTRATE 600+D PO) Take by mouth.     doxycycline  (VIBRA -TABS) 100 MG tablet Take 1 tablet (100 mg total) by mouth 2 (two) times daily for 10 days. 20 tablet 0   doxycycline  (VIBRA -TABS) 100 MG tablet Take 1 tablet (100 mg total) by mouth 2 (two) times daily for 7 days. 14 tablet 0   fluticasone  furoate-vilanterol (BREO ELLIPTA ) 100-25 MCG/ACT AEPB Inhale 1 puff into the lungs daily. 30 each 5   meloxicam  (MOBIC ) 15 MG tablet Take 1 tablet (15 mg total) by mouth daily. 60 tablet 1   methylPREDNISolone  (MEDROL  DOSEPAK) 4 MG TBPK tablet 6 day dose pack - take as directed 21 tablet 0   Multiple Vitamins-Minerals (CENTRUM SILVER PO) Take by mouth.     pravastatin  (PRAVACHOL ) 40 MG tablet TAKE 1 TABLET BY MOUTH ONCE DAILY 90 tablet 1   predniSONE  (DELTASONE ) 10 MG tablet Take 6 tablets x 1 day and then decrease by 1/2 tablet per day until down to zero mg. 39 tablet 0   predniSONE  (DELTASONE ) 20 MG tablet 40 mg daily for 5 days then 20 mg daily for 5 days 15 tablet 0   predniSONE  (DELTASONE ) 20 MG tablet Take 2 tablets (40 mg total) by mouth daily with breakfast for 5 days. 10 tablet 0   predniSONE  (DELTASONE ) 20 MG tablet Take 2 tablets (40 mg total) by mouth daily with breakfast for 5 days. 10 tablet 0   No facility-administered medications prior to visit.    Allergies[1]  ROS Review of Systems Negative unless indicated in HPI.    Objective:    Physical  Exam Constitutional:      Appearance: Normal appearance.  Cardiovascular:     Rate and Rhythm: Normal rate and regular rhythm.     Pulses: Normal pulses.     Heart sounds: Normal heart sounds.  Pulmonary:     Breath sounds: Wheezing present.  Neurological:     General: No focal deficit present.     Mental Status: She is alert  and oriented to person, place, and time.  Psychiatric:        Mood and Affect: Mood normal.        Behavior: Behavior normal.     BP 114/64   Pulse 76   Temp 97.8 F (36.6 C)   Ht 5' 5 (1.651 m)   Wt 156 lb (70.8 kg)   SpO2 95%   BMI 25.96 kg/m  Wt Readings from Last 3 Encounters:  11/09/24 156 lb (70.8 kg)  11/08/24 155 lb 6.8 oz (70.5 kg)  11/06/24 155 lb 9.6 oz (70.6 kg)     Health Maintenance  Topic Date Due   DTaP/Tdap/Td (2 - Td or Tdap) 12/06/2020   Pneumococcal Vaccine: 50+ Years (3 of 3 - PCV20 or PCV21) 10/31/2023   COVID-19 Vaccine (4 - 2025-26 season) 06/04/2024   Medicare Annual Wellness (AWV)  03/12/2025   Mammogram  10/02/2026   Colonoscopy  09/17/2031   Influenza Vaccine  Completed   Bone Density Scan  Completed   Hepatitis C Screening  Completed   Zoster Vaccines- Shingrix  Completed   Meningococcal B Vaccine  Aged Out    There are no preventive care reminders to display for this patient.  Lab Results  Component Value Date   TSH 1.72 04/02/2024   Lab Results  Component Value Date   WBC 5.6 11/08/2024   HGB 13.7 11/08/2024   HCT 40.0 11/08/2024   MCV 86.2 11/08/2024   PLT 214 11/08/2024   Lab Results  Component Value Date   NA 139 11/08/2024   K 4.1 11/08/2024   CO2 24 11/08/2024   GLUCOSE 117 (H) 11/08/2024   BUN 20 11/08/2024   CREATININE 0.88 11/08/2024   BILITOT 0.5 04/02/2024   ALKPHOS 72 04/02/2024   AST 19 04/02/2024   ALT 15 04/02/2024   PROT 7.0 04/02/2024   ALBUMIN 4.4 04/02/2024   CALCIUM  8.9 11/08/2024   ANIONGAP 13 11/08/2024   GFR 79.58 04/02/2024   Lab Results  Component  Value Date   CHOL 207 (H) 04/02/2024   Lab Results  Component Value Date   HDL 68.20 04/02/2024   Lab Results  Component Value Date   LDLCALC 124 (H) 04/02/2024   Lab Results  Component Value Date   TRIG 75.0 04/02/2024   Lab Results  Component Value Date   CHOLHDL 3 04/02/2024   No results found for: HGBA1C    Assessment & Plan:   Assessment & Plan   Assessment and Plan Assessment & Plan       Follow-up: No follow-ups on file.   Deedra Pro, NP      [1] Allergies Allergen Reactions   Penicillins     Hives, rash  "

## 2024-11-15 ENCOUNTER — Ambulatory Visit: Admitting: Primary Care

## 2025-03-13 ENCOUNTER — Ambulatory Visit

## 2025-04-03 ENCOUNTER — Ambulatory Visit: Admitting: Internal Medicine
